# Patient Record
Sex: Male | Born: 1935 | Race: White | Hispanic: No | Marital: Married | Smoking: Former smoker
Health system: Southern US, Community
[De-identification: ages and names within clinical notes are randomized; demographics above are authoritative.]

## PROBLEM LIST (undated history)

## (undated) DIAGNOSIS — M1990 Unspecified osteoarthritis, unspecified site: Secondary | ICD-10-CM

## (undated) DIAGNOSIS — K625 Hemorrhage of anus and rectum: Secondary | ICD-10-CM

## (undated) DIAGNOSIS — F419 Anxiety disorder, unspecified: Secondary | ICD-10-CM

## (undated) DIAGNOSIS — F32A Depression, unspecified: Secondary | ICD-10-CM

## (undated) DIAGNOSIS — F329 Major depressive disorder, single episode, unspecified: Secondary | ICD-10-CM

## (undated) DIAGNOSIS — J449 Chronic obstructive pulmonary disease, unspecified: Secondary | ICD-10-CM

## (undated) DIAGNOSIS — K635 Polyp of colon: Secondary | ICD-10-CM

## (undated) DIAGNOSIS — I10 Essential (primary) hypertension: Secondary | ICD-10-CM

## (undated) DIAGNOSIS — I4891 Unspecified atrial fibrillation: Secondary | ICD-10-CM

## (undated) DIAGNOSIS — I499 Cardiac arrhythmia, unspecified: Secondary | ICD-10-CM

## (undated) DIAGNOSIS — J189 Pneumonia, unspecified organism: Secondary | ICD-10-CM

## (undated) HISTORY — PX: CHOLECYSTECTOMY: SHX55

---

## 2006-06-06 ENCOUNTER — Ambulatory Visit: Payer: Self-pay | Admitting: Cardiology

## 2007-11-19 ENCOUNTER — Ambulatory Visit (HOSPITAL_COMMUNITY): Admission: RE | Admit: 2007-11-19 | Discharge: 2007-11-19 | Payer: Self-pay | Admitting: Ophthalmology

## 2011-01-20 ENCOUNTER — Other Ambulatory Visit (INDEPENDENT_AMBULATORY_CARE_PROVIDER_SITE_OTHER): Payer: Self-pay | Admitting: *Deleted

## 2011-01-20 DIAGNOSIS — Z86010 Personal history of colonic polyps: Secondary | ICD-10-CM

## 2011-02-02 MED ORDER — SODIUM CHLORIDE 0.45 % IV SOLN
Freq: Once | INTRAVENOUS | Status: AC
  Administered 2011-02-03: 08:00:00 via INTRAVENOUS

## 2011-02-03 ENCOUNTER — Encounter (HOSPITAL_COMMUNITY): Payer: Self-pay

## 2011-02-03 ENCOUNTER — Other Ambulatory Visit (INDEPENDENT_AMBULATORY_CARE_PROVIDER_SITE_OTHER): Payer: Self-pay | Admitting: Internal Medicine

## 2011-02-03 ENCOUNTER — Ambulatory Visit (HOSPITAL_COMMUNITY)
Admission: RE | Admit: 2011-02-03 | Discharge: 2011-02-03 | Disposition: A | Discharge status: Home / Self Care | Payer: Medicare Other | Source: Ambulatory Visit | Attending: Internal Medicine | Admitting: Internal Medicine

## 2011-02-03 ENCOUNTER — Encounter (INDEPENDENT_AMBULATORY_CARE_PROVIDER_SITE_OTHER): Payer: Self-pay | Admitting: Internal Medicine

## 2011-02-03 ENCOUNTER — Encounter (HOSPITAL_COMMUNITY)
Admission: RE | Disposition: A | Discharge status: Home / Self Care | Payer: Self-pay | Source: Ambulatory Visit | Attending: Internal Medicine

## 2011-02-03 DIAGNOSIS — Z86010 Personal history of colonic polyps: Secondary | ICD-10-CM

## 2011-02-03 DIAGNOSIS — D126 Benign neoplasm of colon, unspecified: Secondary | ICD-10-CM

## 2011-02-03 HISTORY — DX: Cardiac arrhythmia, unspecified: I49.9

## 2011-02-03 HISTORY — PX: COLONOSCOPY: SHX5424

## 2011-02-03 HISTORY — DX: Essential (primary) hypertension: I10

## 2011-02-03 SURGERY — COLONOSCOPY
Anesthesia: Moderate Sedation

## 2011-02-03 MED ORDER — MIDAZOLAM HCL 5 MG/5ML IJ SOLN
INTRAMUSCULAR | Status: DC | PRN
  Administered 2011-02-03 (×2): 2 mg via INTRAVENOUS

## 2011-02-03 MED ORDER — STERILE WATER FOR IRRIGATION IR SOLN
Status: AC | PRN
  Administered 2011-02-03: 09:00:00

## 2011-02-03 MED ORDER — MIDAZOLAM HCL 5 MG/5ML IJ SOLN
INTRAMUSCULAR | Status: AC
  Filled 2011-02-03: qty 10

## 2011-02-03 MED ORDER — MEPERIDINE HCL 50 MG/ML IJ SOLN
INTRAMUSCULAR | Status: AC
  Filled 2011-02-03: qty 1

## 2011-02-03 MED ORDER — MEPERIDINE HCL 50 MG/ML IJ SOLN
INTRAMUSCULAR | Status: DC | PRN
  Administered 2011-02-03: 25 mg via INTRAVENOUS

## 2011-02-03 MED ORDER — SODIUM CHLORIDE 0.9 % IJ SOLN
INTRAMUSCULAR | Status: DC | PRN
  Administered 2011-02-03: 5 mL

## 2011-02-03 NOTE — Op Note (Signed)
COLONOSCOPY PROCEDURE REPORT  PATIENT:  Raymond Nunez  MR#:  [id number] Birthdate:  07-02-1929, 77 y.o., male Endoscopist:  Dr. Malissa Hippo, MD Referred By:  Dr. Donzetta Sprung, M.D. Procedure Date: 02/03/2011  Procedure:   Colonoscopy with polypectomy.  Indications:  Patient is 77 year old Caucasian male with history of complex sessile cecal polyp and piecemeal polypectomy plus APC therapy in September 2011 complicated by post-polypectomy bleed controlled endoscopically. Patient not requiring transfusion to is now returning to make sure he does not have any residual polyp.  Informed Consent: Seizure and risks were reviewed with the patient informed consent was obtained. Medications:  Demerol 25  mg IV Versed 4  mg IV  Description of procedure:  After a digital rectal exam was performed, that colonoscope was advanced from the anus through the rectum and colon to the area of the cecum, ileocecal valve and appendiceal orifice. The cecum was deeply intubated. These structures were well-seen and photographed for the record. From the level of the cecum and ileocecal valve, the scope was slowly and cautiously withdrawn. The mucosal surfaces were carefully surveyed utilizing scope tip to flexion to facilitate fold flattening as needed. The scope was pulled down into the rectum where a thorough exam including retroflexion was performed.  Findings:   Preparation was satisfactory except she had thick stool coating cecal mucosa. After vigorous washing landmarks were well-seen. 8 mm sessile polyp at cecum. Polyp was snared after saline injection. Unable to place a clip at polypectomy site. His interval cecal polyp which was partly snared and partly coagulated with APC. 3 mm cecal polyp ablated with APC. Mall polyp at distal sigmoid colon was coagulated with APC. Anorectal junction unremarkable.  Therapeutic/Diagnostic Maneuvers Performed:  See above  Complications:  None  Cecal Withdrawal Time:  34  minutes  Impression:  Residual cecal polyp was treated with a combination of polypectomy and APC. 8 mm sessile polyp snared from cecum following saline injection. Two small polyps were coagulated with APC; one was at cecum and the second one at distal sigmoid colon.  Recommendations:  Resume Coumadin on 02/05/2011. No aspirin for 10 days. I will be contacting patient with results of biopsy.   REHMAN,NAJEEB U  02/03/2011 9:29 AM  CC: Dr. No primary provider on file. & Dr. No ref. provider found

## 2011-02-03 NOTE — H&P (Signed)
Raymond Nunez is an 77 y.o. male.   Chief Complaint: Patient is here for colonoscopy. HPI: This 77 year old Caucasian male who was found to have large sessile cecal polyp moved in September 2011. He is returning for 6 to make sure he does not have any residual polyp. He denies abdominal pain melena or rectal bleeding. Family history is negative for colorectal carcinoma.   Past Medical History  Diagnosis Date  . Hypertension   . Dysrhythmia     Past Surgical History  Procedure Date  . Cholecystectomy     History reviewed. No pertinent family history. Social History:  reports that he has never smoked. He does not have any smokeless tobacco history on file. He reports that he does not use illicit drugs. His alcohol history not on file.  Allergies: No Known Allergies  Medications Prior to Admission  Medication Dose Route Frequency Provider Last Rate Last Dose  . 0.45 % sodium chloride infusion   Intravenous Once Malissa Hippo, MD 20 mL/hr at 02/03/11 0801    . meperidine (DEMEROL) 50 MG/ML injection           . midazolam (VERSED) 5 MG/5ML injection            Medications Prior to Admission  Medication Sig Dispense Refill  . acetaminophen (TYLENOL) 325 MG tablet Take 650 mg by mouth every 6 (six) hours as needed. For back pain.       Marland Kitchen acetaminophen (TYLENOL) 650 MG CR tablet Take 650 mg by mouth every 8 (eight) hours as needed. For back pain [employer]       . allopurinol (ZYLOPRIM) 300 MG tablet Take 300 mg by mouth daily.        . cholecalciferol (VITAMIN D) 1000 UNITS tablet Take 1,000 Units by mouth daily.        . citalopram (CELEXA) 20 MG tablet Take 20 mg by mouth daily.        Marland Kitchen diltiazem (CARDIZEM CD) 240 MG 24 hr capsule Take 240 mg by mouth daily.        . fish oil-omega-3 fatty acids 1000 MG capsule Take 2 g by mouth daily.        Marland Kitchen GLUCOSAMINE PO Take 2,000 mg by mouth daily.        Marland Kitchen lidocaine (LIDODERM) 5 % Place 1 patch onto the skin daily as needed. For back  pain. Remove & Discard patch within 12 hours or as directed by MD       . losartan-hydrochlorothiazide (HYZAAR) 50-12.5 MG per tablet Take 1 tablet by mouth.        . metoprolol (LOPRESSOR) 50 MG tablet Take 50 mg by mouth 2 (two) times daily.        . Multiple Vitamins-Minerals (MULTIVITAMINS THER. W/MINERALS) TABS Take 1 tablet by mouth daily. Centrum Heart       . Probiotic Product (HEALTHY COLON PO) Take 1 tablet by mouth daily.        . ranitidine (ZANTAC) 150 MG tablet Take 150 mg by mouth 2 (two) times daily.        . vitamin B-12 (CYANOCOBALAMIN) 1000 MCG tablet Take 1,000 mcg by mouth daily.        Marland Kitchen warfarin (COUMADIN) 2 MG tablet Take 2 mg by mouth daily.          No results found for this or any previous visit (from the past 48 hour(s)). No results found.  Review of Systems  Constitutional: Negative for  weight loss.  Gastrointestinal: Negative for abdominal pain, diarrhea, constipation, blood in stool and melena.    Blood pressure 122/89, pulse 84, temperature 98.3 F (36.8 C), temperature source Oral, resp. rate 18, height 5\' 9"  (1.753 m), weight 225 lb (102.059 kg), SpO2 95.00%. Physical Exam  Constitutional: He appears well-developed and well-nourished.  HENT:  Mouth/Throat: Oropharynx is clear and moist.  Eyes: Conjunctivae are normal. No scleral icterus.  Neck: No thyromegaly present.  Cardiovascular: Normal rate and normal heart sounds.   No murmur heard.      Irregular rhythm  Respiratory: Effort normal and breath sounds normal.  GI: He exhibits no distension and no mass.  Musculoskeletal: He exhibits no edema.  Lymphadenopathy:    He has no cervical adenopathy.  Neurological: He is alert.  Skin: Skin is warm and dry.     Assessment/Plan History of complex sessile cecal polyp. Colonoscopy with polypectomy if he has residual polyp  Emil Klassen U 02/03/2011, 8:39 AM

## 2011-02-04 ENCOUNTER — Telehealth (INDEPENDENT_AMBULATORY_CARE_PROVIDER_SITE_OTHER): Payer: Self-pay | Admitting: Internal Medicine

## 2011-02-04 ENCOUNTER — Encounter (HOSPITAL_COMMUNITY): Payer: Self-pay | Admitting: *Deleted

## 2011-02-04 ENCOUNTER — Inpatient Hospital Stay (HOSPITAL_COMMUNITY)
Admission: EM | Admit: 2011-02-04 | Discharge: 2011-02-05 | DRG: 921 | Disposition: A | Discharge status: Home / Self Care | Payer: Medicare Other | Attending: Internal Medicine | Admitting: Internal Medicine

## 2011-02-04 DIAGNOSIS — K922 Gastrointestinal hemorrhage, unspecified: Secondary | ICD-10-CM

## 2011-02-04 DIAGNOSIS — I9589 Other hypotension: Secondary | ICD-10-CM | POA: Diagnosis present

## 2011-02-04 DIAGNOSIS — I4891 Unspecified atrial fibrillation: Secondary | ICD-10-CM | POA: Diagnosis present

## 2011-02-04 DIAGNOSIS — IMO0002 Reserved for concepts with insufficient information to code with codable children: Principal | ICD-10-CM | POA: Diagnosis present

## 2011-02-04 DIAGNOSIS — D126 Benign neoplasm of colon, unspecified: Secondary | ICD-10-CM | POA: Diagnosis present

## 2011-02-04 DIAGNOSIS — F439 Reaction to severe stress, unspecified: Secondary | ICD-10-CM

## 2011-02-04 DIAGNOSIS — I10 Essential (primary) hypertension: Secondary | ICD-10-CM | POA: Diagnosis present

## 2011-02-04 DIAGNOSIS — K219 Gastro-esophageal reflux disease without esophagitis: Secondary | ICD-10-CM | POA: Diagnosis present

## 2011-02-04 DIAGNOSIS — Y838 Other surgical procedures as the cause of abnormal reaction of the patient, or of later complication, without mention of misadventure at the time of the procedure: Secondary | ICD-10-CM | POA: Diagnosis present

## 2011-02-04 HISTORY — DX: Hemorrhage of anus and rectum: K62.5

## 2011-02-04 LAB — TYPE AND SCREEN: Antibody Screen: NEGATIVE

## 2011-02-04 LAB — COMPREHENSIVE METABOLIC PANEL
ALT: 10 U/L (ref 0–53)
AST: 20 U/L (ref 0–37)
Albumin: 3.7 g/dL (ref 3.5–5.2)
Calcium: 9.2 mg/dL (ref 8.4–10.5)
Creatinine, Ser: 1.06 mg/dL (ref 0.50–1.35)
Sodium: 138 mEq/L (ref 135–145)
Total Protein: 7 g/dL (ref 6.0–8.3)

## 2011-02-04 LAB — CBC
MCH: 31.4 pg (ref 26.0–34.0)
MCHC: 33.1 g/dL (ref 30.0–36.0)
MCV: 94.8 fL (ref 78.0–100.0)
Platelets: 187 10*3/uL (ref 150–400)
RDW: 14.3 % (ref 11.5–15.5)
WBC: 13 10*3/uL — ABNORMAL HIGH (ref 4.0–10.5)

## 2011-02-04 LAB — DIFFERENTIAL
Basophils Absolute: 0 10*3/uL (ref 0.0–0.1)
Basophils Relative: 0 % (ref 0–1)
Eosinophils Absolute: 0 10*3/uL (ref 0.0–0.7)
Eosinophils Relative: 0 % (ref 0–5)
Lymphocytes Relative: 12 % (ref 12–46)
Monocytes Absolute: 1 10*3/uL (ref 0.1–1.0)

## 2011-02-04 LAB — PROTIME-INR: INR: 1.04 (ref 0.00–1.49)

## 2011-02-04 LAB — HEMOGLOBIN AND HEMATOCRIT, BLOOD: Hemoglobin: 13.2 g/dL (ref 13.0–17.0)

## 2011-02-04 MED ORDER — DILTIAZEM HCL ER COATED BEADS 240 MG PO CP24
240.0000 mg | ORAL_CAPSULE | Freq: Every day | ORAL | Status: DC
  Administered 2011-02-05: 240 mg via ORAL
  Filled 2011-02-04: qty 1

## 2011-02-04 MED ORDER — SODIUM CHLORIDE 0.9 % IV SOLN
INTRAVENOUS | Status: DC
  Administered 2011-02-04 – 2011-02-05 (×3): via INTRAVENOUS

## 2011-02-04 MED ORDER — METOPROLOL TARTRATE 50 MG PO TABS
50.0000 mg | ORAL_TABLET | Freq: Every day | ORAL | Status: DC
  Administered 2011-02-05: 50 mg via ORAL
  Filled 2011-02-04: qty 1

## 2011-02-04 NOTE — ED Notes (Signed)
Pt c/o bleeding from his rectum. Pt states he woke this am at 0200 feeling pressure in lower abd and thought he had gas. Pt states he then went to the restroom and noticed blood in the toilet. Pt states he has gone 2 other times since then and blood was still present.

## 2011-02-04 NOTE — Telephone Encounter (Signed)
Patient had a tcs yesterday, 02/03/11, and has been up all night bleeding. Please return the call.

## 2011-02-04 NOTE — H&P (Signed)
Patient is 77 year old Caucasian male who was admitted earlier today were post-polypectomy bleed. He underwent colonoscopy yesterday that the snare polypectomy of cecal polyp along with the polypectomy and APC of residual cecal polyp. His bleeding started around 2 AM. He did not experience any abdominal pain or postural symptoms. His blood pressure on admission was low felt to be due to cardiazam and metoprolol. His INR is 1.04 H&H on admission was 15.6 and 47.1 with platelet count 187K BUN is 17 creatinine is 1.06 Both of these polyps turned out to be sessile serrated adenomas. It appears patient has either stop bleeding or slow down as he is gone over 6 hours without bowel movement hour therefore watch him closely. If there is evidence of active bleed will proceed with therapeutic colonoscopy

## 2011-02-04 NOTE — Telephone Encounter (Signed)
I called the patient's wife at [phone] , she states that Mr. Raymond Nunez awoke at 2 am with rectal bleeding , and noted this x 3. This morning he has had 2 more episodes. He complains of feeling weak, no dizziness. Dr. Karilyn Cota notified,  He states that the patient should be direct admitt to [facility] - Post Polypectomy Bleed. Per admitting to [facility] no beds at this time , patient should go to ER. Dr. Karilyn Cota made aware and [name]. She is going to bring him , [name], NP called ED/[facility] and made them aware.

## 2011-02-04 NOTE — H&P (Signed)
Raymond Nunez is an 77 y.o. male.   Chief Complaint: HPI Raymond Nunez is 77 yr old male presenting today with rectal bleeding. He has history of a complex sessile cecal polyp and piecemeal polypectomy plus APC therapy in September of 2011 complicated by post post-polypectomy bleed controlled endoscopically. He underwent a colonoscopy with polypectomy yesterday 02/03/2011. He had a residual  cecal polyp that was treated with a combination of polypectomy and APC.   He also had a 8mm sessile polyp snard from cecum following saline injection.  Two small polyps were coagulated with APC; one was at the cecum and the second one at the distal sigmoid colon. He tells me that around 2 am he began to have rectal bleeding which he describes as bright red.  He has had 3 other episodes today of bright red rectal bleeding which he describes as a large amount. Dr. Karilyn Cota spoke with Raymond Nunez earlier today and was advised that he would be directly admitted. However, there were no available beds and he was advised to go to the Emergency Dept.   Past Medical History  Diagnosis Date  . Hypertension   . Dysrhythmia   . Rectal bleeding     Past Surgical History  Procedure Date  . Cholecystectomy     History reviewed. No pertinent family history. Social History:  reports that he has never smoked. He does not have any smokeless tobacco history on file. He reports that he does not drink alcohol or use illicit drugs.  Allergies: No Known Allergies  Medications Prior to Admission  Medication Dose Route Frequency Provider Last Rate Last Dose  . 0.45 % sodium chloride infusion   Intravenous Once Malissa Hippo, MD 20 mL/hr at 02/03/11 0801    . 0.9 %  sodium chloride infusion   Intravenous Continuous Llana Aliment, NP      . simethicone susp in sterile water 1000 mL irrigation    PRN Malissa Hippo, MD      . DISCONTD: meperidine (DEMEROL) 50 MG/ML injection           . DISCONTD: meperidine (DEMEROL) injection    PRN Malissa Hippo, MD   25 mg at 02/03/11 0844  . DISCONTD: midazolam (VERSED) 5 MG/5ML injection           . DISCONTD: midazolam (VERSED) 5 MG/5ML injection    PRN Malissa Hippo, MD   2 mg at 02/03/11 0846  . DISCONTD: sodium chloride 0.9 % injection    PRN Malissa Hippo, MD   5 mL at 02/03/11 0933   Medications Prior to Admission  Medication Sig Dispense Refill  . acetaminophen (TYLENOL) 325 MG tablet Take 650 mg by mouth every 6 (six) hours as needed. For back pain.       Marland Kitchen acetaminophen (TYLENOL) 650 MG CR tablet Take 650 mg by mouth every 8 (eight) hours as needed. For back pain [employer]       . allopurinol (ZYLOPRIM) 300 MG tablet Take 300 mg by mouth daily.        . cholecalciferol (VITAMIN D) 1000 UNITS tablet Take 1,000 Units by mouth daily.        . citalopram (CELEXA) 20 MG tablet Take 20 mg by mouth daily.        Marland Kitchen diltiazem (CARDIZEM CD) 240 MG 24 hr capsule Take 240 mg by mouth daily.        . fish oil-omega-3 fatty acids 1000 MG capsule Take  2 g by mouth daily.        Marland Kitchen GLUCOSAMINE PO Take 2,000 mg by mouth daily.        Marland Kitchen lidocaine (LIDODERM) 5 % Place 1 patch onto the skin daily as needed. For back pain. Remove & Discard patch within 12 hours or as directed by MD       . losartan-hydrochlorothiazide (HYZAAR) 50-12.5 MG per tablet Take 1 tablet by mouth.        . metoprolol (LOPRESSOR) 50 MG tablet Take 50 mg by mouth 2 (two) times daily.        . Multiple Vitamins-Minerals (MULTIVITAMINS THER. Nunez/MINERALS) TABS Take 1 tablet by mouth daily. Centrum Heart       . Probiotic Product (HEALTHY COLON PO) Take 1 tablet by mouth daily.        . ranitidine (ZANTAC) 150 MG tablet Take 150 mg by mouth 2 (two) times daily.        . vitamin B-12 (CYANOCOBALAMIN) 1000 MCG tablet Take 1,000 mcg by mouth daily.          No results found for this or any previous visit (from the past 48 hour(s)). No results found.  ROS see hpi  Blood pressure 97/71, pulse 79, temperature 97.5 F (36.4  C), temperature source Oral, resp. rate 16, height 5\' 9"  (1.753 m), weight 225 lb (102.059 kg), SpO2 96.00%. Physical Exam Alert and oriented. Skin warm and dry. Oral mucosa is moist. Natural teeth in good condition. Sclera anicteric, conjunctivae is pink. Thyroid not enlarged. No cervical lymphadenopathy. Lungs clear. Heart regular rate and rhythm.  Abdomen is soft. Bowel sounds are positive. No hepatomegaly. No abdominal masses felt. No tenderness. He has bright red blood from his rectum (small amt). No edema to lower extremities. Patient is alert and oriented.   Assessment/Plan  Probably post polypectomy bleed.  Plan.   IV NS, CBC, PT/INR, Cmet, Keep patient NPO. Dr. Karilyn Cota will be in to see patient. His vitals at this time are stable.  Raymond Nunez 02/04/2011, 10:58 AM

## 2011-02-05 LAB — CBC
MCH: 32 pg (ref 26.0–34.0)
MCHC: 33.8 g/dL (ref 30.0–36.0)
Platelets: 152 10*3/uL (ref 150–400)

## 2011-02-05 MED ORDER — CITALOPRAM HYDROBROMIDE 20 MG PO TABS
20.0000 mg | ORAL_TABLET | Freq: Every day | ORAL | Status: DC
  Administered 2011-02-05: 20 mg via ORAL
  Filled 2011-02-05: qty 1

## 2011-02-05 MED ORDER — WARFARIN SODIUM 2 MG PO TABS: 2.0000 mg | ORAL_TABLET | Freq: Every day | ORAL | Status: AC

## 2011-02-05 MED ORDER — SODIUM CHLORIDE 0.9 % IJ SOLN
INTRAMUSCULAR | Status: AC
  Administered 2011-02-05: 11:00:00
  Filled 2011-02-05: qty 3

## 2011-02-05 NOTE — Progress Notes (Signed)
Subjective; patient has no complaints. He had a bowel movement last night when he passed some clots and old blood. He had another one this morning when he passed possibly an ounce of dark blood in stool. He is hungry. He is ambulating on the floor without any weakness. Objective BP 118/67  Pulse 79  Temp(Src) 98.1 F (36.7 C) (Oral)  Resp 18  Ht 5\' 9"  (1.753 m)  Wt 225 lb (102.059 kg)  BMI 33.23 kg/m2  SpO2 94% H&H last night was 13.2 and 39.2. H&H from this morning is 13.3 and 39.4. Assessment. Post polypectomy bleed has stopped without any intervention. His hemoglobin has dropped by over 2 g some of the drop may be due to hydration. Plan Will monitor him in the hospital for 6 hours or so. Unless he rebleeds he will be going home later today. He is back on his cardiazam, metoprolol and Celexa. Will resume his Coumadin middle of next week.

## 2011-02-06 NOTE — Discharge Summary (Signed)
Principal diagnosis; post-polypectomy GI bleed.  Secondary diagnoses; Transient hypotension secondary to medications. Atrial fibrillation. Hypertension. Stress disorder. GERD. History of gout. Chronic low back pain. Condition at the time of discharge. Stable.  Discharge medications. Citalopram 20 mg by mouth daily. Diltiazem 240 mg by mouth daily Losartan/hydrochlorothiazide 50/12.5 mg by mouth daily. Metoprolol 50 mg by mouth twice a day. Ranitidine 75 mg by mouth twice a day. Fish oil make a 3 fatty acids 1 g by mouth twice a day. Glucosamine 2 g by mouth daily. Multivitamin with minerals 1 tablet by mouth daily. B12 1000 mcg by mouth daily. Allopurinol 300 mg by mouth daily. Tylenol 650 mg by mouth every 6 hours as needed for back pain. Vitamin D 1000 units by mouth daily. Lidoderm patch to back in the as needed as directed. Probiotic one tablet daily by mouth. Warfarin 2 mg by mouth daily; start on 02/09/2011.  Hospital course; Patient is 77 year old Caucasian male with history of colonic polyps. He had large sessile cecal polyp removed in September 2011 indicated by post-polypectomy bleed he did not require any transfusion. He had 2 cecal polyps removed date prior to admission. He began to pass bright red blood per rectum but 12 hours after polypectomy. He did not experience any postural symptoms or abdominal pain. On admission his blood pressure was low at heart rate was within normal limits. His blood pressure came up with IV fluids. Since his H&H was normal or above normal for him I felt that he may have mild dehydration or hypotension could be secondary to his medications which he took prior to coming to the hospital. Admission his WBC was 13.0, hemoglobin 15.6, hematocrit 47.1 and platelet count was 187K. his chemistry panel was normal except random glucose of 134. INR was 1.04. Patient was typed and crossmatched. He was begun on clear liquids and monitored. He had a bowel  movement about 8 hours after admission any pass small amount of dark blood. Hemoglobin and hematocrit were repeated on the evening of admission. [place]od is 13.2 and hematocrit 39.2. Following morning he had a bowel movement in the past possibly an ounce of old blood and some stool. Hemoglobin was 13.3 and hematocrit 39.4. I felt drop in his H&H was do to combination of bleed and hydration. Patient was allowed to ambulate on the floor rest of the morning and most of the afternoon and had no more bowel movements or bleeding. Given his clinical course I felt that there was no need for colonoscopy since he had stopped bleeding. Patient  was discharged in stable condition and he was advised to resume warfarin on 02/09/2011. He was also asked not to take any aspirin or OTC NSAIDs. Patient was advised to come back to emergency room in case of recurrence bleeding. Path. was available on these polyps and they were both serrated adenomas.  He will get his INR checked in 10-14 days after going back on warfarin

## 2011-02-10 ENCOUNTER — Encounter (HOSPITAL_COMMUNITY): Payer: Self-pay | Admitting: Internal Medicine

## 2011-02-10 ENCOUNTER — Encounter (INDEPENDENT_AMBULATORY_CARE_PROVIDER_SITE_OTHER): Payer: Self-pay | Admitting: *Deleted

## 2011-04-26 ENCOUNTER — Other Ambulatory Visit (HOSPITAL_COMMUNITY): Payer: Self-pay | Admitting: Internal Medicine

## 2011-04-26 NOTE — Telephone Encounter (Signed)
Sent to the wrong physician.

## 2012-03-21 ENCOUNTER — Encounter (INDEPENDENT_AMBULATORY_CARE_PROVIDER_SITE_OTHER): Payer: Self-pay

## 2014-04-16 DIAGNOSIS — I482 Chronic atrial fibrillation: Secondary | ICD-10-CM | POA: Diagnosis not present

## 2014-04-24 DIAGNOSIS — X32XXXA Exposure to sunlight, initial encounter: Secondary | ICD-10-CM | POA: Diagnosis not present

## 2014-04-24 DIAGNOSIS — I781 Nevus, non-neoplastic: Secondary | ICD-10-CM | POA: Diagnosis not present

## 2014-04-24 DIAGNOSIS — L570 Actinic keratosis: Secondary | ICD-10-CM | POA: Diagnosis not present

## 2014-05-05 DIAGNOSIS — I10 Essential (primary) hypertension: Secondary | ICD-10-CM | POA: Diagnosis not present

## 2014-05-05 DIAGNOSIS — K210 Gastro-esophageal reflux disease with esophagitis: Secondary | ICD-10-CM | POA: Diagnosis not present

## 2014-05-05 DIAGNOSIS — E782 Mixed hyperlipidemia: Secondary | ICD-10-CM | POA: Diagnosis not present

## 2014-05-05 DIAGNOSIS — R7302 Impaired glucose tolerance (oral): Secondary | ICD-10-CM | POA: Diagnosis not present

## 2014-05-15 DIAGNOSIS — E782 Mixed hyperlipidemia: Secondary | ICD-10-CM | POA: Diagnosis not present

## 2014-05-15 DIAGNOSIS — F331 Major depressive disorder, recurrent, moderate: Secondary | ICD-10-CM | POA: Diagnosis not present

## 2014-05-15 DIAGNOSIS — E8881 Metabolic syndrome: Secondary | ICD-10-CM | POA: Diagnosis not present

## 2014-05-15 DIAGNOSIS — M1000 Idiopathic gout, unspecified site: Secondary | ICD-10-CM | POA: Diagnosis not present

## 2014-05-15 DIAGNOSIS — Z1389 Encounter for screening for other disorder: Secondary | ICD-10-CM | POA: Diagnosis not present

## 2014-05-28 DIAGNOSIS — I482 Chronic atrial fibrillation: Secondary | ICD-10-CM | POA: Diagnosis not present

## 2014-07-02 DIAGNOSIS — I482 Chronic atrial fibrillation: Secondary | ICD-10-CM | POA: Diagnosis not present

## 2014-08-06 DIAGNOSIS — I482 Chronic atrial fibrillation: Secondary | ICD-10-CM | POA: Diagnosis not present

## 2014-08-07 DIAGNOSIS — D225 Melanocytic nevi of trunk: Secondary | ICD-10-CM | POA: Diagnosis not present

## 2014-08-07 DIAGNOSIS — C44619 Basal cell carcinoma of skin of left upper limb, including shoulder: Secondary | ICD-10-CM | POA: Diagnosis not present

## 2014-08-07 DIAGNOSIS — L570 Actinic keratosis: Secondary | ICD-10-CM | POA: Diagnosis not present

## 2014-08-07 DIAGNOSIS — X32XXXD Exposure to sunlight, subsequent encounter: Secondary | ICD-10-CM | POA: Diagnosis not present

## 2014-08-07 DIAGNOSIS — L280 Lichen simplex chronicus: Secondary | ICD-10-CM | POA: Diagnosis not present

## 2014-09-10 DIAGNOSIS — I482 Chronic atrial fibrillation: Secondary | ICD-10-CM | POA: Diagnosis not present

## 2014-10-15 DIAGNOSIS — I482 Chronic atrial fibrillation: Secondary | ICD-10-CM | POA: Diagnosis not present

## 2014-11-19 DIAGNOSIS — I482 Chronic atrial fibrillation: Secondary | ICD-10-CM | POA: Diagnosis not present

## 2014-12-30 DIAGNOSIS — E8881 Metabolic syndrome: Secondary | ICD-10-CM | POA: Diagnosis not present

## 2014-12-30 DIAGNOSIS — I10 Essential (primary) hypertension: Secondary | ICD-10-CM | POA: Diagnosis not present

## 2014-12-30 DIAGNOSIS — E782 Mixed hyperlipidemia: Secondary | ICD-10-CM | POA: Diagnosis not present

## 2014-12-30 DIAGNOSIS — R7302 Impaired glucose tolerance (oral): Secondary | ICD-10-CM | POA: Diagnosis not present

## 2014-12-30 DIAGNOSIS — I482 Chronic atrial fibrillation: Secondary | ICD-10-CM | POA: Diagnosis not present

## 2014-12-30 DIAGNOSIS — F331 Major depressive disorder, recurrent, moderate: Secondary | ICD-10-CM | POA: Diagnosis not present

## 2015-01-01 DIAGNOSIS — M1000 Idiopathic gout, unspecified site: Secondary | ICD-10-CM | POA: Diagnosis not present

## 2015-01-01 DIAGNOSIS — E782 Mixed hyperlipidemia: Secondary | ICD-10-CM | POA: Diagnosis not present

## 2015-01-01 DIAGNOSIS — Z0001 Encounter for general adult medical examination with abnormal findings: Secondary | ICD-10-CM | POA: Diagnosis not present

## 2015-01-01 DIAGNOSIS — F331 Major depressive disorder, recurrent, moderate: Secondary | ICD-10-CM | POA: Diagnosis not present

## 2015-01-01 DIAGNOSIS — E8881 Metabolic syndrome: Secondary | ICD-10-CM | POA: Diagnosis not present

## 2015-01-01 DIAGNOSIS — Z23 Encounter for immunization: Secondary | ICD-10-CM | POA: Diagnosis not present

## 2015-01-20 DIAGNOSIS — K219 Gastro-esophageal reflux disease without esophagitis: Secondary | ICD-10-CM | POA: Diagnosis not present

## 2015-01-20 DIAGNOSIS — S20211A Contusion of right front wall of thorax, initial encounter: Secondary | ICD-10-CM | POA: Diagnosis not present

## 2015-01-27 DIAGNOSIS — I482 Chronic atrial fibrillation: Secondary | ICD-10-CM | POA: Diagnosis not present

## 2015-01-27 DIAGNOSIS — R0602 Shortness of breath: Secondary | ICD-10-CM | POA: Diagnosis not present

## 2015-01-27 DIAGNOSIS — I10 Essential (primary) hypertension: Secondary | ICD-10-CM | POA: Diagnosis not present

## 2015-01-30 DIAGNOSIS — I358 Other nonrheumatic aortic valve disorders: Secondary | ICD-10-CM | POA: Diagnosis not present

## 2015-01-30 DIAGNOSIS — I340 Nonrheumatic mitral (valve) insufficiency: Secondary | ICD-10-CM | POA: Diagnosis not present

## 2015-01-30 DIAGNOSIS — R0602 Shortness of breath: Secondary | ICD-10-CM | POA: Diagnosis not present

## 2015-01-30 DIAGNOSIS — I361 Nonrheumatic tricuspid (valve) insufficiency: Secondary | ICD-10-CM | POA: Diagnosis not present

## 2015-01-30 DIAGNOSIS — R609 Edema, unspecified: Secondary | ICD-10-CM | POA: Diagnosis not present

## 2015-01-30 DIAGNOSIS — I083 Combined rheumatic disorders of mitral, aortic and tricuspid valves: Secondary | ICD-10-CM | POA: Diagnosis not present

## 2015-02-03 DIAGNOSIS — I482 Chronic atrial fibrillation: Secondary | ICD-10-CM | POA: Diagnosis not present

## 2015-02-11 DIAGNOSIS — J449 Chronic obstructive pulmonary disease, unspecified: Secondary | ICD-10-CM | POA: Diagnosis not present

## 2015-02-11 DIAGNOSIS — R072 Precordial pain: Secondary | ICD-10-CM | POA: Diagnosis not present

## 2015-03-03 DIAGNOSIS — I10 Essential (primary) hypertension: Secondary | ICD-10-CM | POA: Diagnosis not present

## 2015-03-03 DIAGNOSIS — J449 Chronic obstructive pulmonary disease, unspecified: Secondary | ICD-10-CM | POA: Diagnosis not present

## 2015-03-05 ENCOUNTER — Other Ambulatory Visit (HOSPITAL_COMMUNITY): Payer: Self-pay | Admitting: Respiratory Therapy

## 2015-03-05 DIAGNOSIS — R0602 Shortness of breath: Secondary | ICD-10-CM

## 2015-03-09 ENCOUNTER — Other Ambulatory Visit (HOSPITAL_COMMUNITY): Payer: Self-pay | Admitting: Pulmonary Disease

## 2015-03-09 DIAGNOSIS — R0602 Shortness of breath: Secondary | ICD-10-CM

## 2015-03-09 DIAGNOSIS — R0789 Other chest pain: Secondary | ICD-10-CM

## 2015-03-10 DIAGNOSIS — I482 Chronic atrial fibrillation: Secondary | ICD-10-CM | POA: Diagnosis not present

## 2015-03-11 ENCOUNTER — Ambulatory Visit (HOSPITAL_COMMUNITY)
Admission: RE | Admit: 2015-03-11 | Discharge: 2015-03-11 | Disposition: A | Discharge status: Home / Self Care | Payer: Medicare Other | Source: Ambulatory Visit | Attending: Pulmonary Disease | Admitting: Pulmonary Disease

## 2015-03-11 ENCOUNTER — Ambulatory Visit (HOSPITAL_COMMUNITY): Admission: RE | Admit: 2015-03-11 | Payer: Medicare Other | Source: Ambulatory Visit

## 2015-03-11 DIAGNOSIS — R918 Other nonspecific abnormal finding of lung field: Secondary | ICD-10-CM | POA: Diagnosis not present

## 2015-03-11 DIAGNOSIS — Z981 Arthrodesis status: Secondary | ICD-10-CM | POA: Insufficient documentation

## 2015-03-11 DIAGNOSIS — I2510 Atherosclerotic heart disease of native coronary artery without angina pectoris: Secondary | ICD-10-CM | POA: Insufficient documentation

## 2015-03-11 DIAGNOSIS — R0789 Other chest pain: Secondary | ICD-10-CM

## 2015-03-11 DIAGNOSIS — R079 Chest pain, unspecified: Secondary | ICD-10-CM | POA: Insufficient documentation

## 2015-03-11 DIAGNOSIS — R0602 Shortness of breath: Secondary | ICD-10-CM | POA: Diagnosis not present

## 2015-03-11 LAB — POCT I-STAT CREATININE: Creatinine, Ser: 1.1 mg/dL (ref 0.61–1.24)

## 2015-03-11 MED ORDER — IOHEXOL 300 MG/ML  SOLN
75.0000 mL | Freq: Once | INTRAMUSCULAR | Status: AC | PRN
  Administered 2015-03-11: 75 mL via INTRAVENOUS

## 2015-03-13 ENCOUNTER — Ambulatory Visit (HOSPITAL_COMMUNITY)
Admission: RE | Admit: 2015-03-13 | Discharge: 2015-03-13 | Disposition: A | Discharge status: Home / Self Care | Payer: Medicare Other | Source: Ambulatory Visit | Attending: Pulmonary Disease | Admitting: Pulmonary Disease

## 2015-03-13 DIAGNOSIS — R0602 Shortness of breath: Secondary | ICD-10-CM | POA: Diagnosis not present

## 2015-03-13 MED ORDER — ALBUTEROL SULFATE (2.5 MG/3ML) 0.083% IN NEBU
2.5000 mg | INHALATION_SOLUTION | Freq: Once | RESPIRATORY_TRACT | Status: AC
  Administered 2015-03-13: 2.5 mg via RESPIRATORY_TRACT

## 2015-03-16 LAB — PULMONARY FUNCTION TEST
DL/VA % pred: 116 %
DL/VA: 5.25 ml/min/mmHg/L
DLCO UNC % PRED: 69 %
DLCO unc: 21.62 ml/min/mmHg
FEF 25-75 POST: 1.59 L/s
FEF 25-75 Pre: 1.39 L/sec
FEF2575-%CHANGE-POST: 14 %
FEF2575-%PRED-POST: 92 %
FEF2575-%Pred-Pre: 81 %
FEV1-%CHANGE-POST: 4 %
FEV1-%Pred-Post: 71 %
FEV1-%Pred-Pre: 68 %
FEV1-PRE: 1.78 L
FEV1-Post: 1.86 L
FEV1FVC-%Change-Post: -4 %
FEV1FVC-%PRED-PRE: 106 %
FEV6-%Change-Post: 10 %
FEV6-%PRED-POST: 74 %
FEV6-%Pred-Pre: 67 %
FEV6-POST: 2.58 L
FEV6-Pre: 2.34 L
FEV6FVC-%CHANGE-POST: 0 %
FEV6FVC-%PRED-POST: 107 %
FEV6FVC-%Pred-Pre: 106 %
FVC-%CHANGE-POST: 9 %
FVC-%PRED-POST: 69 %
FVC-%Pred-Pre: 63 %
FVC-POST: 2.58 L
FVC-Pre: 2.35 L
PRE FEV1/FVC RATIO: 76 %
PRE FEV6/FVC RATIO: 99 %
Post FEV1/FVC ratio: 72 %
Post FEV6/FVC ratio: 100 %
RV % PRED: 92 %
RV: 2.47 L
TLC % pred: 71 %
TLC: 4.93 L

## 2015-03-23 DIAGNOSIS — D044 Carcinoma in situ of skin of scalp and neck: Secondary | ICD-10-CM | POA: Diagnosis not present

## 2015-03-23 DIAGNOSIS — L570 Actinic keratosis: Secondary | ICD-10-CM | POA: Diagnosis not present

## 2015-03-23 DIAGNOSIS — L820 Inflamed seborrheic keratosis: Secondary | ICD-10-CM | POA: Diagnosis not present

## 2015-03-23 DIAGNOSIS — D0439 Carcinoma in situ of skin of other parts of face: Secondary | ICD-10-CM | POA: Diagnosis not present

## 2015-03-23 DIAGNOSIS — X32XXXD Exposure to sunlight, subsequent encounter: Secondary | ICD-10-CM | POA: Diagnosis not present

## 2015-04-14 DIAGNOSIS — I482 Chronic atrial fibrillation: Secondary | ICD-10-CM | POA: Diagnosis not present

## 2015-04-29 DIAGNOSIS — E782 Mixed hyperlipidemia: Secondary | ICD-10-CM | POA: Diagnosis not present

## 2015-04-29 DIAGNOSIS — I10 Essential (primary) hypertension: Secondary | ICD-10-CM | POA: Diagnosis not present

## 2015-04-29 DIAGNOSIS — R7302 Impaired glucose tolerance (oral): Secondary | ICD-10-CM | POA: Diagnosis not present

## 2015-04-29 DIAGNOSIS — K219 Gastro-esophageal reflux disease without esophagitis: Secondary | ICD-10-CM | POA: Diagnosis not present

## 2015-05-06 DIAGNOSIS — E782 Mixed hyperlipidemia: Secondary | ICD-10-CM | POA: Diagnosis not present

## 2015-05-06 DIAGNOSIS — I482 Chronic atrial fibrillation: Secondary | ICD-10-CM | POA: Diagnosis not present

## 2015-05-06 DIAGNOSIS — I10 Essential (primary) hypertension: Secondary | ICD-10-CM | POA: Diagnosis not present

## 2015-06-15 DIAGNOSIS — I482 Chronic atrial fibrillation: Secondary | ICD-10-CM | POA: Diagnosis not present

## 2015-06-15 DIAGNOSIS — I352 Nonrheumatic aortic (valve) stenosis with insufficiency: Secondary | ICD-10-CM | POA: Diagnosis not present

## 2015-07-15 DIAGNOSIS — I482 Chronic atrial fibrillation: Secondary | ICD-10-CM | POA: Diagnosis not present

## 2015-07-15 DIAGNOSIS — I352 Nonrheumatic aortic (valve) stenosis with insufficiency: Secondary | ICD-10-CM | POA: Diagnosis not present

## 2015-08-04 DIAGNOSIS — R072 Precordial pain: Secondary | ICD-10-CM | POA: Diagnosis not present

## 2015-08-10 DIAGNOSIS — R079 Chest pain, unspecified: Secondary | ICD-10-CM | POA: Diagnosis not present

## 2015-08-10 DIAGNOSIS — R072 Precordial pain: Secondary | ICD-10-CM | POA: Diagnosis not present

## 2015-08-12 DIAGNOSIS — I482 Chronic atrial fibrillation: Secondary | ICD-10-CM | POA: Diagnosis not present

## 2015-08-12 DIAGNOSIS — I352 Nonrheumatic aortic (valve) stenosis with insufficiency: Secondary | ICD-10-CM | POA: Diagnosis not present

## 2015-09-07 DIAGNOSIS — R7302 Impaired glucose tolerance (oral): Secondary | ICD-10-CM | POA: Diagnosis not present

## 2015-09-07 DIAGNOSIS — E782 Mixed hyperlipidemia: Secondary | ICD-10-CM | POA: Diagnosis not present

## 2015-09-07 DIAGNOSIS — I10 Essential (primary) hypertension: Secondary | ICD-10-CM | POA: Diagnosis not present

## 2015-09-09 DIAGNOSIS — I10 Essential (primary) hypertension: Secondary | ICD-10-CM | POA: Diagnosis not present

## 2015-09-09 DIAGNOSIS — E782 Mixed hyperlipidemia: Secondary | ICD-10-CM | POA: Diagnosis not present

## 2015-09-16 DIAGNOSIS — I352 Nonrheumatic aortic (valve) stenosis with insufficiency: Secondary | ICD-10-CM | POA: Diagnosis not present

## 2015-10-21 DIAGNOSIS — I352 Nonrheumatic aortic (valve) stenosis with insufficiency: Secondary | ICD-10-CM | POA: Diagnosis not present

## 2015-12-03 DIAGNOSIS — I482 Chronic atrial fibrillation: Secondary | ICD-10-CM | POA: Diagnosis not present

## 2015-12-03 DIAGNOSIS — I352 Nonrheumatic aortic (valve) stenosis with insufficiency: Secondary | ICD-10-CM | POA: Diagnosis not present

## 2016-01-07 DIAGNOSIS — I10 Essential (primary) hypertension: Secondary | ICD-10-CM | POA: Diagnosis not present

## 2016-01-07 DIAGNOSIS — Z0000 Encounter for general adult medical examination without abnormal findings: Secondary | ICD-10-CM | POA: Diagnosis not present

## 2016-01-07 DIAGNOSIS — E782 Mixed hyperlipidemia: Secondary | ICD-10-CM | POA: Diagnosis not present

## 2016-01-13 DIAGNOSIS — E782 Mixed hyperlipidemia: Secondary | ICD-10-CM | POA: Diagnosis not present

## 2016-01-13 DIAGNOSIS — Z1212 Encounter for screening for malignant neoplasm of rectum: Secondary | ICD-10-CM | POA: Diagnosis not present

## 2016-01-13 DIAGNOSIS — N401 Enlarged prostate with lower urinary tract symptoms: Secondary | ICD-10-CM | POA: Diagnosis not present

## 2016-01-13 DIAGNOSIS — I482 Chronic atrial fibrillation: Secondary | ICD-10-CM | POA: Diagnosis not present

## 2016-01-13 DIAGNOSIS — Z23 Encounter for immunization: Secondary | ICD-10-CM | POA: Diagnosis not present

## 2016-01-13 DIAGNOSIS — Z0001 Encounter for general adult medical examination with abnormal findings: Secondary | ICD-10-CM | POA: Diagnosis not present

## 2016-01-26 ENCOUNTER — Encounter (INDEPENDENT_AMBULATORY_CARE_PROVIDER_SITE_OTHER): Payer: Self-pay | Admitting: Internal Medicine

## 2016-02-02 ENCOUNTER — Ambulatory Visit (INDEPENDENT_AMBULATORY_CARE_PROVIDER_SITE_OTHER): Payer: Medicare Other | Admitting: Internal Medicine

## 2016-02-02 ENCOUNTER — Encounter (INDEPENDENT_AMBULATORY_CARE_PROVIDER_SITE_OTHER): Payer: Self-pay | Admitting: Internal Medicine

## 2016-02-02 ENCOUNTER — Encounter (INDEPENDENT_AMBULATORY_CARE_PROVIDER_SITE_OTHER): Payer: Self-pay | Admitting: *Deleted

## 2016-02-02 ENCOUNTER — Telehealth (INDEPENDENT_AMBULATORY_CARE_PROVIDER_SITE_OTHER): Payer: Self-pay | Admitting: *Deleted

## 2016-02-02 ENCOUNTER — Encounter (INDEPENDENT_AMBULATORY_CARE_PROVIDER_SITE_OTHER): Payer: Self-pay

## 2016-02-02 ENCOUNTER — Other Ambulatory Visit (INDEPENDENT_AMBULATORY_CARE_PROVIDER_SITE_OTHER): Payer: Self-pay | Admitting: Internal Medicine

## 2016-02-02 VITALS — BP 120/86 | HR 65 | Temp 98.3°F | Ht 69.0 in | Wt 244.5 lb

## 2016-02-02 DIAGNOSIS — Z86010 Personal history of colonic polyps: Secondary | ICD-10-CM

## 2016-02-02 DIAGNOSIS — I10 Essential (primary) hypertension: Secondary | ICD-10-CM | POA: Insufficient documentation

## 2016-02-02 DIAGNOSIS — R195 Other fecal abnormalities: Secondary | ICD-10-CM | POA: Diagnosis not present

## 2016-02-02 MED ORDER — PEG 3350-KCL-NA BICARB-NACL 420 G PO SOLR: 4000.0000 mL | Freq: Once | ORAL | 0 refills | Status: AC

## 2016-02-02 NOTE — Telephone Encounter (Signed)
Patient needs trilyte 

## 2016-02-02 NOTE — Patient Instructions (Signed)
Colonoscopy, The risks and benefits such as perforation, bleeding, and infection were reviewed with the patient and is agreeable.

## 2016-02-02 NOTE — Progress Notes (Signed)
Subjective:    Patient ID: Raymond Nunez, male    DOB: [birth year], 82 y.o.   MRN: [id number]  HPI Referred by Dr [name] for + stool card. He denies seeing any blood. No change in his stools. There is no abdominal pain. Appetite is good. No weight loss. His last colonoscopy wasin 20212 with polypectomy (see below)   Maintained on Coumadin (Per records, he has hx of atrial fib)   01/08/2016 H and H 15.0 and 45.2 02/03/2011  Colonoscopy with polypectomy.  Indications:  Patient is 82 year old Caucasian male with history of complex sessile cecal polyp and piecemeal polypectomy plus APC therapy in September [id number] complicated by post-polypectomy bleed controlled endoscopically. Patient not requiring transfusion to is now returning to make sure he does not have any residual polyp. Impression:  Residual cecal polyp was treated with a combination of polypectomy and APC. 8 mm sessile polyp snared from cecum following saline injection. Two small polyps were coagulated with APC; one was at cecum and the second one at distal sigmoid colon.  Both cecal polyps are serrated adenomas; I went over the results with the patient and his wife. He was admitted yesterday for post-polypectomy bleed and will be going home later today. May consider next exam in 5 years  Review of Systems Past Medical History:  Diagnosis Date  . Dysrhythmia   . Hypertension   . Rectal bleeding     Past Surgical History:  Procedure Laterality Date  . CHOLECYSTECTOMY    . COLONOSCOPY  02/03/2011   Procedure: COLONOSCOPY;  Surgeon: Rogene Houston, MD;  Location: [facility];  Service: Endoscopy;  Laterality: N/A;   Current Outpatient Prescriptions  Medication Sig Dispense Refill  . acetaminophen (TYLENOL) 325 MG tablet Take 650 mg by mouth every 6 (six) hours as needed. For back pain.     Marland Kitchen allopurinol (ZYLOPRIM) 300 MG tablet Take 300 mg by mouth daily.      . cholecalciferol (VITAMIN D) 1000 UNITS tablet Take 1,000  Units by mouth daily.      . citalopram (CELEXA) 20 MG tablet Take 20 mg by mouth daily.      Marland Kitchen diltiazem (CARDIZEM CD) 240 MG 24 hr capsule Take 240 mg by mouth daily.      Marland Kitchen GLUCOSAMINE PO Take 2,000 mg by mouth daily.      Marland Kitchen losartan-hydrochlorothiazide (HYZAAR) 50-12.5 MG per tablet Take 1 tablet by mouth.      . metoprolol (LOPRESSOR) 50 MG tablet Take 100 mg by mouth daily.     . psyllium (METAMUCIL) 58.6 % packet Take 1 packet by mouth daily.    . ranitidine (ZANTAC) 75 MG tablet Take 75 mg by mouth 2 (two) times daily.      . traZODone (DESYREL) 50 MG tablet Take 50 mg by mouth at bedtime.    . vitamin B-12 (CYANOCOBALAMIN) 1000 MCG tablet Take 1,000 mcg by mouth daily.      Marland Kitchen warfarin (COUMADIN) 2 MG tablet Take 1 tablet (2 mg total) by mouth daily. (Patient taking differently: Take 3 mg by mouth daily. Takes 2 mg one day and the next 1 mg.) 30 tablet 1  . fish oil-omega-3 fatty acids 1000 MG capsule Take 1 g by mouth 2 (two) times daily.      No current facility-administered medications for this visit.    Facility-Administered Medications Ordered in Other Visits  Medication Dose Route Frequency Provider Last Rate Last Dose  . simethicone susp in sterile water  1000 mL irrigation    PRN Rogene Houston, MD        No Known Allergies       Objective:   Physical Exam  Vitals:   02/02/16 1357  Weight: 244 lb 8 oz (110.9 kg)  Height: 5\' 9"  (1.753 m)   Alert and oriented. Skin warm and dry. Oral mucosa is moist.   . Sclera anicteric, conjunctivae is pink. Thyroid not enlarged. No cervical lymphadenopathy. Lungs clear. Heart regular rate and rhythm.  Abdomen is soft. Bowel sounds are positive. No hepatomegaly. No abdominal masses felt. No tenderness.  No edema to lower extremities.          Assessment & Plan:  Guaiac + stool.  Hx of complex polyp. Needs surveillance colonoscopy.   The risks and benefits such as perforation, bleeding, and infection were reviewed with the  patient and is agreeable.

## 2016-02-04 ENCOUNTER — Telehealth (INDEPENDENT_AMBULATORY_CARE_PROVIDER_SITE_OTHER): Payer: Self-pay | Admitting: *Deleted

## 2016-02-04 NOTE — Telephone Encounter (Signed)
Recommendations. Noted.

## 2016-02-04 NOTE — Telephone Encounter (Signed)
Patient is sch'd for TCS 02/19/16, per Dr [name] it is ok for patient to hold Warfarin 5 days prior to TCS but needs to restart evening after procedure

## 2016-02-05 ENCOUNTER — Encounter (INDEPENDENT_AMBULATORY_CARE_PROVIDER_SITE_OTHER): Payer: Self-pay

## 2016-02-19 ENCOUNTER — Ambulatory Visit (HOSPITAL_COMMUNITY)
Admission: RE | Admit: 2016-02-19 | Discharge: 2016-02-19 | Disposition: A | Discharge status: Home / Self Care | Payer: Medicare Other | Source: Ambulatory Visit | Attending: Internal Medicine | Admitting: Internal Medicine

## 2016-02-19 ENCOUNTER — Encounter (HOSPITAL_COMMUNITY): Payer: Self-pay | Admitting: *Deleted

## 2016-02-19 ENCOUNTER — Encounter (HOSPITAL_COMMUNITY)
Admission: RE | Disposition: A | Discharge status: Home / Self Care | Payer: Self-pay | Source: Ambulatory Visit | Attending: Internal Medicine

## 2016-02-19 DIAGNOSIS — Z86010 Personal history of colonic polyps: Secondary | ICD-10-CM | POA: Diagnosis not present

## 2016-02-19 DIAGNOSIS — Z87891 Personal history of nicotine dependence: Secondary | ICD-10-CM | POA: Diagnosis not present

## 2016-02-19 DIAGNOSIS — Z7901 Long term (current) use of anticoagulants: Secondary | ICD-10-CM | POA: Insufficient documentation

## 2016-02-19 DIAGNOSIS — D122 Benign neoplasm of ascending colon: Secondary | ICD-10-CM | POA: Diagnosis not present

## 2016-02-19 DIAGNOSIS — K644 Residual hemorrhoidal skin tags: Secondary | ICD-10-CM | POA: Diagnosis not present

## 2016-02-19 DIAGNOSIS — I4891 Unspecified atrial fibrillation: Secondary | ICD-10-CM | POA: Diagnosis not present

## 2016-02-19 DIAGNOSIS — R195 Other fecal abnormalities: Secondary | ICD-10-CM | POA: Diagnosis not present

## 2016-02-19 DIAGNOSIS — F329 Major depressive disorder, single episode, unspecified: Secondary | ICD-10-CM | POA: Diagnosis not present

## 2016-02-19 DIAGNOSIS — I10 Essential (primary) hypertension: Secondary | ICD-10-CM | POA: Insufficient documentation

## 2016-02-19 DIAGNOSIS — D120 Benign neoplasm of cecum: Secondary | ICD-10-CM | POA: Diagnosis not present

## 2016-02-19 HISTORY — PX: COLONOSCOPY: SHX5424

## 2016-02-19 HISTORY — DX: Major depressive disorder, single episode, unspecified: F32.9

## 2016-02-19 HISTORY — PX: POLYPECTOMY: SHX5525

## 2016-02-19 HISTORY — DX: Polyp of colon: K63.5

## 2016-02-19 HISTORY — DX: Depression, unspecified: F32.A

## 2016-02-19 HISTORY — DX: Unspecified atrial fibrillation: I48.91

## 2016-02-19 SURGERY — COLONOSCOPY
Anesthesia: Moderate Sedation

## 2016-02-19 MED ORDER — SODIUM CHLORIDE 0.9 % IV SOLN
INTRAVENOUS | Status: DC
  Administered 2016-02-19: 11:00:00 via INTRAVENOUS

## 2016-02-19 MED ORDER — MIDAZOLAM HCL 5 MG/5ML IJ SOLN
INTRAMUSCULAR | Status: DC | PRN
  Administered 2016-02-19: 2 mg via INTRAVENOUS
  Administered 2016-02-19 (×2): 1 mg via INTRAVENOUS

## 2016-02-19 MED ORDER — STERILE WATER FOR IRRIGATION IR SOLN
Status: DC | PRN
  Administered 2016-02-19: 11:00:00

## 2016-02-19 MED ORDER — MEPERIDINE HCL 50 MG/ML IJ SOLN
INTRAMUSCULAR | Status: AC
  Filled 2016-02-19: qty 1

## 2016-02-19 MED ORDER — MEPERIDINE HCL 50 MG/ML IJ SOLN
INTRAMUSCULAR | Status: DC | PRN
  Administered 2016-02-19: 25 mg via INTRAVENOUS

## 2016-02-19 MED ORDER — MIDAZOLAM HCL 5 MG/5ML IJ SOLN
INTRAMUSCULAR | Status: AC
  Filled 2016-02-19: qty 10

## 2016-02-19 NOTE — Discharge Instructions (Signed)
Resume usual medications including warfarin starting this evening. Resume usual diet. No driving for 24 hours. INR check in 10 days. Physician will call with biopsy results.       Colonoscopy, Adult, Care After This sheet gives you information about how to care for yourself after your procedure. Your doctor may also give you more specific instructions. If you have problems or questions, call your doctor. Follow these instructions at home: General instructions  For the first 24 hours after the procedure:  Do not drive or use machinery.  Do not sign important documents.  Do not drink alcohol.  Do your daily activities more slowly than normal.  Eat foods that are soft and easy to digest.  Rest often.  Take over-the-counter or prescription medicines only as told by your doctor.  It is up to you to get the results of your procedure. Ask your doctor, or the department performing the procedure, when your results will be ready. To help cramping and bloating:  Try walking around.  Put heat on your belly (abdomen) as told by your doctor. Use a heat source that your doctor recommends, such as a moist heat pack or a heating pad.  Put a towel between your skin and the heat source.  Leave the heat on for 20-30 minutes.  Remove the heat if your skin turns bright red. This is especially important if you cannot feel pain, heat, or cold. You can get burned. Eating and drinking  Drink enough fluid to keep your pee (urine) clear or pale yellow.  Return to your normal diet as told by your doctor. Avoid heavy or fried foods that are hard to digest.  Avoid drinking alcohol for as long as told by your doctor. Contact a doctor if:  You have blood in your poop (stool) 2-3 days after the procedure. Get help right away if:  You have more than a small amount of blood in your poop.  You see large clumps of tissue (blood clots) in your poop.  Your belly is swollen.  You feel sick to  your stomach (nauseous).  You throw up (vomit).  You have a fever.  You have belly pain that gets worse, and medicine does not help your pain. This information is not intended to replace advice given to you by your health care provider. Make sure you discuss any questions you have with your health care provider. Document Released: 04/23/2010 Document Revised: 12/14/2015 Document Reviewed: 12/14/2015 Elsevier Interactive Patient Education  2017 [employer].    Colon Polyps Introduction Polyps are tissue growths inside the body. Polyps can grow in many places, including the large intestine (colon). A polyp may be a round bump or a mushroom-shaped growth. You could have one polyp or several. Most colon polyps are noncancerous (benign). However, some colon polyps can become cancerous over time. What are the causes? The exact cause of colon polyps is not known. What increases the risk? This condition is more likely to develop in people who:  Have a family history of colon cancer or colon polyps.  Are older than 49 or older than 45 if they are African American.  Have inflammatory bowel disease, such as ulcerative colitis or Crohn disease.  Are overweight.  Smoke cigarettes.  Do not get enough exercise.  Drink too much alcohol.  Eat a diet that is:  High in fat and red meat.  Low in fiber.  Had childhood cancer that was treated with abdominal radiation. What are the signs or  symptoms? Most polyps do not cause symptoms. If you have symptoms, they may include:  Blood coming from your rectum when having a bowel movement.  Blood in your stool.The stool may look dark red or black.  A change in bowel habits, such as constipation or diarrhea. How is this diagnosed? This condition is diagnosed with a colonoscopy. This is a procedure that uses a lighted, flexible scope to look at the inside of your colon. How is this treated? Treatment for this condition involves removing any  polyps that are found. Those polyps will then be tested for cancer. If cancer is found, your health care provider will talk to you about options for colon cancer treatment. Follow these instructions at home: Diet  Eat plenty of fiber, such as fruits, vegetables, and whole grains.  Eat foods that are high in calcium and vitamin D, such as milk, cheese, yogurt, eggs, liver, fish, and broccoli.  Limit foods high in fat, red meats, and processed meats, such as hot dogs, sausage, bacon, and lunch meats.  Maintain a healthy weight, or lose weight if recommended by your health care provider. General instructions  Do not smoke cigarettes.  Do not drink alcohol excessively.  Keep all follow-up visits as told by your health care provider. This is important. This includes keeping regularly scheduled colonoscopies. Talk to your health care provider about when you need a colonoscopy.  Exercise every day or as told by your health care provider. Contact a health care provider if:  You have new or worsening bleeding during a bowel movement.  You have new or increased blood in your stool.  You have a change in bowel habits.  You unexpectedly lose weight. This information is not intended to replace advice given to you by your health care provider. Make sure you discuss any questions you have with your health care provider. Document Released: 12/16/2003 Document Revised: 08/27/2015 Document Reviewed: 02/09/2015  2017 Elsevier

## 2016-02-19 NOTE — Op Note (Signed)
[facility] Patient Name: Raymond Nunez Procedure Date: 02/19/2016 11:14 AM MRN: [id number] Date of Birth: [birth year] Attending MD: Hildred Laser , MD CSN: [id number] Age: 82 Admit Type: Outpatient Procedure:                Colonoscopy Indications:              Heme positive stool, Follow-up for history of colon                            polyps Providers:                Hildred Laser, MD, Otis Peak B. Sharon Seller, RN, Sherlyn Lees, Technician Referring MD:             Mitzie Na. [name], MD Medicines:                Meperidine 25 mg IV, Midazolam 4 mg IV Complications:            No immediate complications. Estimated Blood Loss:     Estimated blood loss was minimal. Procedure:                Pre-Anesthesia Assessment:                           - Prior to the procedure, a History and Physical                            was performed, and patient medications and                            allergies were reviewed. The patient's tolerance of                            previous anesthesia was also reviewed. The risks                            and benefits of the procedure and the sedation                            options and risks were discussed with the patient.                            All questions were answered, and informed consent                            was obtained. Prior Anticoagulants: The patient                            last took Coumadin (warfarin) 5 days prior to the                            procedure. ASA Grade Assessment: III - A patient  with severe systemic disease. After reviewing the                            risks and benefits, the patient was deemed in                            satisfactory condition to undergo the procedure.                           After obtaining informed consent, the colonoscope                            was passed under direct vision. Throughout the   procedure, the patient's blood pressure, pulse, and                            oxygen saturations were monitored continuously. The                            [id number]) scope was introduced through                            the anus and advanced to the the cecum, identified                            by appendiceal orifice and ileocecal valve. The                            colonoscopy was performed without difficulty. The                            patient tolerated the procedure well. The quality                            of the bowel preparation was adequate to identify                            polyps. The ileocecal valve, appendiceal orifice,                            and rectum were photographed. Scope In: 11:26:00 AM Scope Out: 11:56:37 AM Scope Withdrawal Time: 0 hours 25 minutes 25 seconds  Total Procedure Duration: 0 hours 30 minutes 37 seconds  Findings:      A 4 mm polyp was found in the cecum. The polyp was sessile. Biopsies       were taken with a cold forceps for histology. The pathology specimen was       placed into Bottle Number 1.      A 8 mm polyp was found in the ascending colon. The polyp was sessile. To       prevent bleeding after the polypectomy, two hemostatic clips were       successfully placed (MR conditional). There was no bleeding at the end       of the procedure.      External hemorrhoids were found during  retroflexion. The hemorrhoids       were small. Impression:               - One 4 mm polyp in the cecum. Biopsied.                           - One 8 mm polyp in the ascending colon. Clips (MR                            conditional) were placed.                           - External hemorrhoids. Moderate Sedation:      Moderate (conscious) sedation was administered by the endoscopy nurse       and supervised by the endoscopist. The following parameters were       monitored: oxygen saturation, heart rate, blood pressure, CO2        capnography and response to care. Total physician intraservice time was       34 minutes. Recommendation:           - Patient has a contact number available for                            emergencies. The signs and symptoms of potential                            delayed complications were discussed with the                            patient. Return to normal activities tomorrow.                            Written discharge instructions were provided to the                            patient.                           - Resume previous diet today.                           - Continue present medications.                           - Resume Coumadin (warfarin) at prior dose today.                           - Await pathology results.                           - No recommendation at this time regarding repeat                            colonoscopy. Procedure Code(s):        --- Professional ---  [id number], Colonoscopy, flexible; with biopsy, single                            or multiple                           99152, Moderate sedation services provided by the                            same physician or other qualified health care                            professional performing the diagnostic or                            therapeutic service that the sedation supports,                            requiring the presence of an independent trained                            observer to assist in the monitoring of the                            patient's level of consciousness and physiological                            status; initial 15 minutes of intraservice time,                            patient age 40 years or older                           [phone], Moderate sedation services; each additional                            15 minutes intraservice time Diagnosis Code(s):        --- Professional ---                           D12.0, Benign neoplasm of cecum                            D12.2, Benign neoplasm of ascending colon                           K64.4, Residual hemorrhoidal skin tags                           R19.5, Other fecal abnormalities                           Z86.010, Personal history of colonic polyps CPT copyright 2016 American Medical Association. All rights reserved. The codes documented in this report are preliminary and upon coder review may  be revised to meet current compliance requirements. [employer],  MD Hildred Laser, MD 02/19/2016 12:06:15 PM This report has been signed electronically. Number of Addenda: 0

## 2016-02-19 NOTE — H&P (Signed)
Raymond Nunez is an 82 y.o. male.   Chief Complaint: Patient is here for colonoscopy. HPI: Patient is 82 year old Caucasian male who was history of colonic adenomas whose last colonoscopy was in November 2012. He was noted to have heme-positive stool. He denies rectal bleeding abdominal pain change in bowel habits. Last warfarin dose was on 02/10/2016. Family history is negative for CRC.  Past Medical History:  Diagnosis Date  . Atrial fibrillation ([place])   . Colon polyps   . Depression   . Dysrhythmia   . Hypertension   . Rectal bleeding     Past Surgical History:  Procedure Laterality Date  . CHOLECYSTECTOMY    . COLONOSCOPY  02/03/2011   Procedure: COLONOSCOPY;  Surgeon: Rogene Houston, MD;  Location: [facility];  Service: Endoscopy;  Laterality: N/A;    No family history on file. Social History:  reports that he quit smoking about 34 years ago. His smoking use included Cigarettes. He has a 25.00 pack-year smoking history. He has never used smokeless tobacco. He reports that he does not drink alcohol or use drugs.  Allergies: No Known Allergies  Medications Prior to Admission  Medication Sig Dispense Refill  . acetaminophen (TYLENOL) 325 MG tablet Take 650 mg by mouth every 6 (six) hours as needed. For back pain.     Marland Kitchen allopurinol (ZYLOPRIM) 300 MG tablet Take 300 mg by mouth daily.      . cholecalciferol (VITAMIN D) 1000 UNITS tablet Take 1,000 Units by mouth daily.      . citalopram (CELEXA) 20 MG tablet Take 20 mg by mouth daily.      Marland Kitchen diltiazem (CARDIZEM CD) 240 MG 24 hr capsule Take 240 mg by mouth daily.      . fish oil-omega-3 fatty acids 1000 MG capsule Take 1 g by mouth 2 (two) times daily.     Marland Kitchen GLUCOSAMINE PO Take 2,000 mg by mouth daily.      Marland Kitchen losartan-hydrochlorothiazide (HYZAAR) 50-12.5 MG per tablet Take 1 tablet by mouth.      . metoprolol (LOPRESSOR) 50 MG tablet Take 100 mg by mouth daily.     . psyllium (METAMUCIL) 58.6 % packet Take 1 packet by mouth  daily.    . ranitidine (ZANTAC) 75 MG tablet Take 75 mg by mouth 2 (two) times daily.      . traZODone (DESYREL) 50 MG tablet Take 50 mg by mouth at bedtime.    . vitamin B-12 (CYANOCOBALAMIN) 1000 MCG tablet Take 1,000 mcg by mouth daily.      Marland Kitchen warfarin (COUMADIN) 2 MG tablet Take 1 tablet (2 mg total) by mouth daily. (Patient taking differently: Take 3 mg by mouth daily. Takes 2 mg one day and the next 1 mg.) 30 tablet 1    No results found for this or any previous visit (from the past 48 hour(s)). No results found.  ROS  Blood pressure (!) 138/97, pulse 90, temperature 97.5 F (36.4 C), temperature source Oral, resp. rate 16, height 5\' 9"  (1.753 m), weight 244 lb (110.7 kg), SpO2 100 %. Physical Exam  Constitutional: He appears well-developed and well-nourished.  HENT:  Mouth/Throat: Oropharynx is clear and moist.  Eyes: Conjunctivae are normal. No scleral icterus.  Neck: No thyromegaly present.  Cardiovascular:  Irregular rhythm normal S1 and S2. No murmur or gallop noted.  Respiratory: Effort normal and breath sounds normal.  GI:  Abdomen is full but soft and nontender without organomegaly or masses.  Musculoskeletal: He  exhibits no edema.  Lymphadenopathy:    He has no cervical adenopathy.  Neurological: He is alert.  Skin: Skin is warm and dry.     Assessment/Plan Heme positive stool. History of colonic adenomas. Diagnostic colonoscopy.  Hildred Laser, MD 02/19/2016, 11:16 AM

## 2016-02-22 ENCOUNTER — Encounter (HOSPITAL_COMMUNITY): Payer: Self-pay | Admitting: Internal Medicine

## 2016-03-02 DIAGNOSIS — I352 Nonrheumatic aortic (valve) stenosis with insufficiency: Secondary | ICD-10-CM | POA: Diagnosis not present

## 2016-03-08 DIAGNOSIS — I482 Chronic atrial fibrillation: Secondary | ICD-10-CM | POA: Diagnosis not present

## 2016-03-14 DIAGNOSIS — J209 Acute bronchitis, unspecified: Secondary | ICD-10-CM | POA: Diagnosis not present

## 2016-03-14 DIAGNOSIS — J449 Chronic obstructive pulmonary disease, unspecified: Secondary | ICD-10-CM | POA: Diagnosis not present

## 2016-03-14 DIAGNOSIS — I482 Chronic atrial fibrillation: Secondary | ICD-10-CM | POA: Diagnosis not present

## 2016-03-23 DIAGNOSIS — J209 Acute bronchitis, unspecified: Secondary | ICD-10-CM | POA: Diagnosis not present

## 2016-04-05 DIAGNOSIS — I352 Nonrheumatic aortic (valve) stenosis with insufficiency: Secondary | ICD-10-CM | POA: Diagnosis not present

## 2016-05-02 DIAGNOSIS — J4530 Mild persistent asthma, uncomplicated: Secondary | ICD-10-CM | POA: Diagnosis not present

## 2016-05-04 DIAGNOSIS — I482 Chronic atrial fibrillation: Secondary | ICD-10-CM | POA: Diagnosis not present

## 2016-05-04 DIAGNOSIS — I352 Nonrheumatic aortic (valve) stenosis with insufficiency: Secondary | ICD-10-CM | POA: Diagnosis not present

## 2016-05-18 DIAGNOSIS — M1000 Idiopathic gout, unspecified site: Secondary | ICD-10-CM | POA: Diagnosis not present

## 2016-05-18 DIAGNOSIS — R05 Cough: Secondary | ICD-10-CM | POA: Diagnosis not present

## 2016-05-18 DIAGNOSIS — Z9189 Other specified personal risk factors, not elsewhere classified: Secondary | ICD-10-CM | POA: Diagnosis not present

## 2016-05-18 DIAGNOSIS — I482 Chronic atrial fibrillation: Secondary | ICD-10-CM | POA: Diagnosis not present

## 2016-05-18 DIAGNOSIS — E782 Mixed hyperlipidemia: Secondary | ICD-10-CM | POA: Diagnosis not present

## 2016-05-18 DIAGNOSIS — J158 Pneumonia due to other specified bacteria: Secondary | ICD-10-CM | POA: Diagnosis not present

## 2016-05-18 DIAGNOSIS — I10 Essential (primary) hypertension: Secondary | ICD-10-CM | POA: Diagnosis not present

## 2016-05-18 DIAGNOSIS — R7302 Impaired glucose tolerance (oral): Secondary | ICD-10-CM | POA: Diagnosis not present

## 2016-05-25 DIAGNOSIS — I10 Essential (primary) hypertension: Secondary | ICD-10-CM | POA: Diagnosis not present

## 2016-05-25 DIAGNOSIS — E782 Mixed hyperlipidemia: Secondary | ICD-10-CM | POA: Diagnosis not present

## 2016-05-26 DIAGNOSIS — L570 Actinic keratosis: Secondary | ICD-10-CM | POA: Diagnosis not present

## 2016-05-26 DIAGNOSIS — C44319 Basal cell carcinoma of skin of other parts of face: Secondary | ICD-10-CM | POA: Diagnosis not present

## 2016-05-26 DIAGNOSIS — X32XXXD Exposure to sunlight, subsequent encounter: Secondary | ICD-10-CM | POA: Diagnosis not present

## 2016-06-08 DIAGNOSIS — J158 Pneumonia due to other specified bacteria: Secondary | ICD-10-CM | POA: Diagnosis not present

## 2016-06-16 DIAGNOSIS — I352 Nonrheumatic aortic (valve) stenosis with insufficiency: Secondary | ICD-10-CM | POA: Diagnosis not present

## 2016-07-18 DIAGNOSIS — I352 Nonrheumatic aortic (valve) stenosis with insufficiency: Secondary | ICD-10-CM | POA: Diagnosis not present

## 2016-08-02 DIAGNOSIS — I5032 Chronic diastolic (congestive) heart failure: Secondary | ICD-10-CM | POA: Diagnosis not present

## 2016-08-09 DIAGNOSIS — I509 Heart failure, unspecified: Secondary | ICD-10-CM | POA: Diagnosis not present

## 2016-08-09 DIAGNOSIS — I2510 Atherosclerotic heart disease of native coronary artery without angina pectoris: Secondary | ICD-10-CM | POA: Diagnosis not present

## 2016-08-09 DIAGNOSIS — I350 Nonrheumatic aortic (valve) stenosis: Secondary | ICD-10-CM | POA: Diagnosis not present

## 2016-08-09 DIAGNOSIS — R0602 Shortness of breath: Secondary | ICD-10-CM | POA: Diagnosis not present

## 2016-08-09 DIAGNOSIS — I5032 Chronic diastolic (congestive) heart failure: Secondary | ICD-10-CM | POA: Diagnosis not present

## 2016-08-09 DIAGNOSIS — R918 Other nonspecific abnormal finding of lung field: Secondary | ICD-10-CM | POA: Diagnosis not present

## 2016-08-15 DIAGNOSIS — I5032 Chronic diastolic (congestive) heart failure: Secondary | ICD-10-CM | POA: Diagnosis not present

## 2016-08-30 DIAGNOSIS — I482 Chronic atrial fibrillation: Secondary | ICD-10-CM | POA: Diagnosis not present

## 2016-09-09 ENCOUNTER — Institutional Professional Consult (permissible substitution): Payer: Medicare Other | Admitting: Internal Medicine

## 2016-09-21 DIAGNOSIS — E782 Mixed hyperlipidemia: Secondary | ICD-10-CM | POA: Diagnosis not present

## 2016-09-21 DIAGNOSIS — I10 Essential (primary) hypertension: Secondary | ICD-10-CM | POA: Diagnosis not present

## 2016-09-21 DIAGNOSIS — I5032 Chronic diastolic (congestive) heart failure: Secondary | ICD-10-CM | POA: Diagnosis not present

## 2016-09-26 DIAGNOSIS — E782 Mixed hyperlipidemia: Secondary | ICD-10-CM | POA: Diagnosis not present

## 2016-09-26 DIAGNOSIS — Z1389 Encounter for screening for other disorder: Secondary | ICD-10-CM | POA: Diagnosis not present

## 2016-09-26 DIAGNOSIS — I10 Essential (primary) hypertension: Secondary | ICD-10-CM | POA: Diagnosis not present

## 2016-09-28 IMAGING — CT CT CHEST W/ CM
2 of 3 series · 15 of 36 positions shown, 18 images · IV contrast (omnipaque)
Comparison: Two-view chest x-ray 02/11/2015

CLINICAL DATA: Chest pain.  Shortness of breath.

EXAM:
CT CHEST WITH CONTRAST
TECHNIQUE: Multidetector CT imaging of the chest was performed during
intravenous contrast administration.
CONTRAST:  75mL OMNIPAQUE IOHEXOL 300 MG/ML  SOLN

[Series 2: chestroutine 5.0 b40f · axial · 0.80mm/px · z∈[-309,-19]mm · 12 of 70 slices shown, 15 images]
[im 6/70  mediastinal]
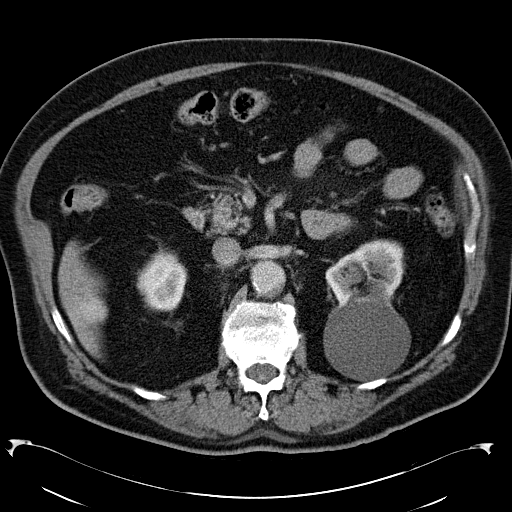
[im 6/70  lung]
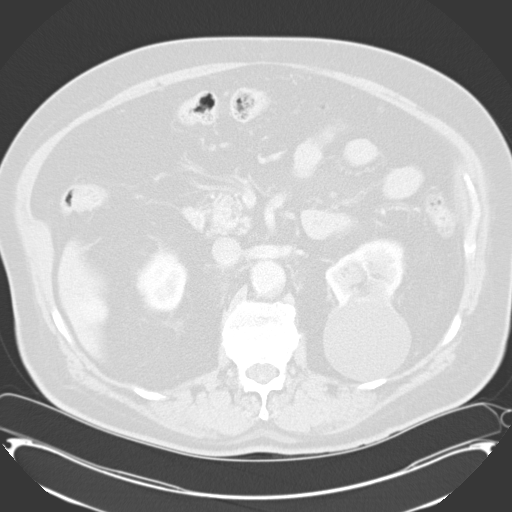
[im 11/70  lung]
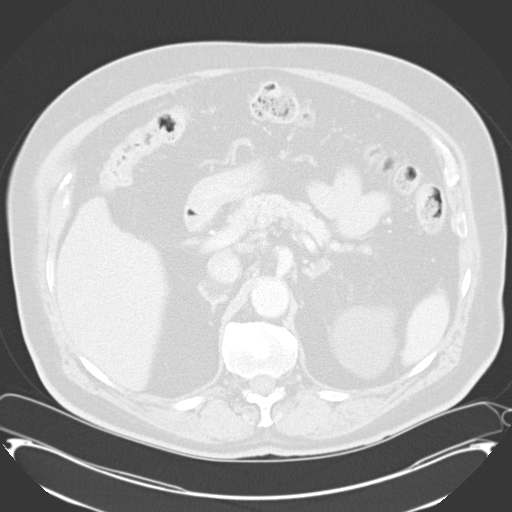
[im 16/70  lung]
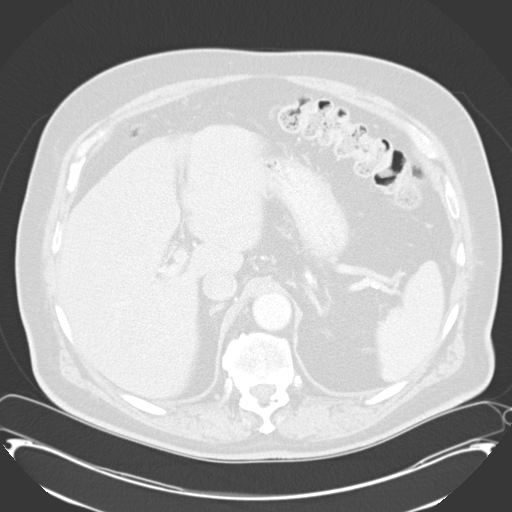
[im 21/70  lung]
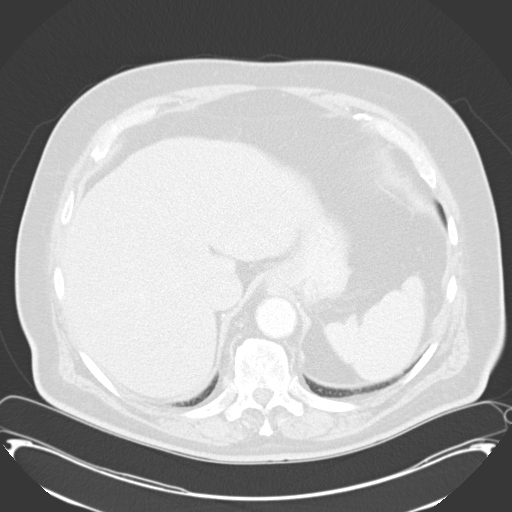
[im 26/70  mediastinal]
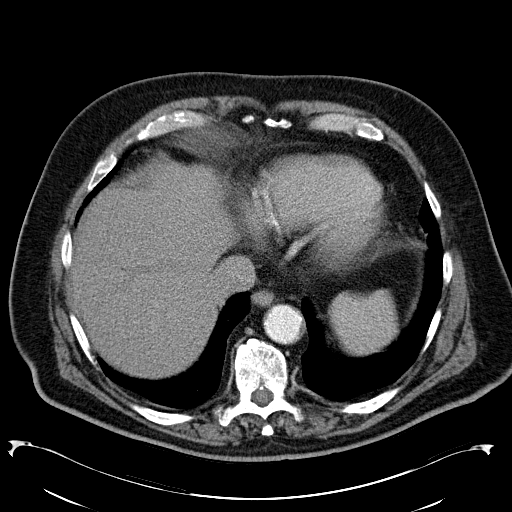
[im 26/70  lung]
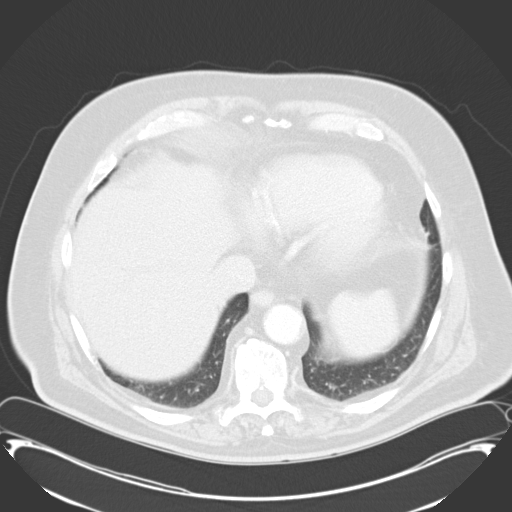
[im 31/70  lung]
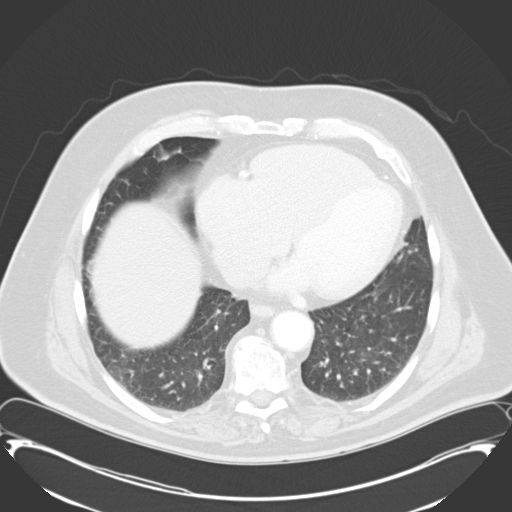
[im 39/70  lung]
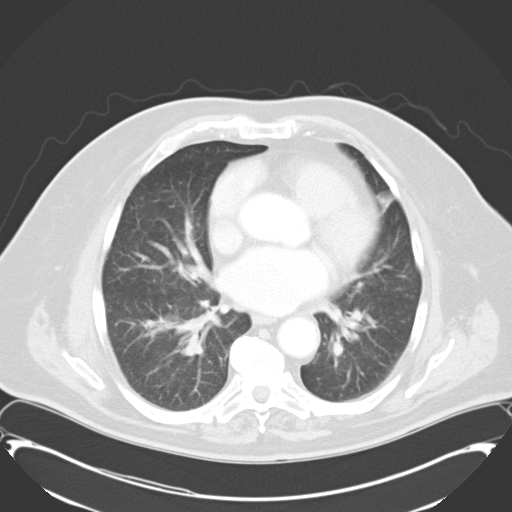
[im 44/70  lung]
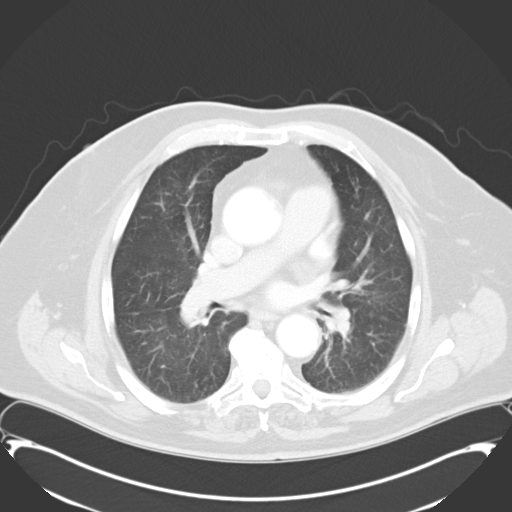
[im 49/70  mediastinal]
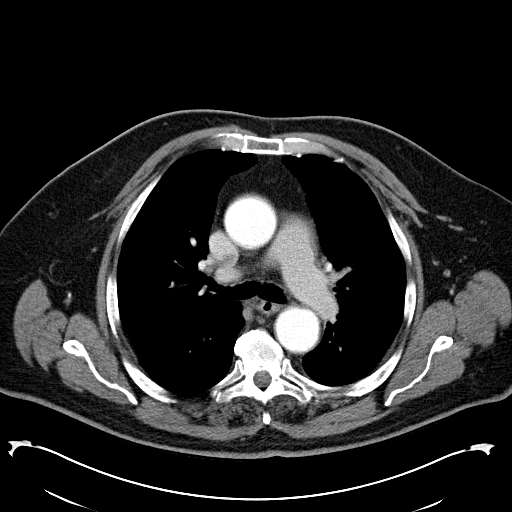
[im 49/70  lung]
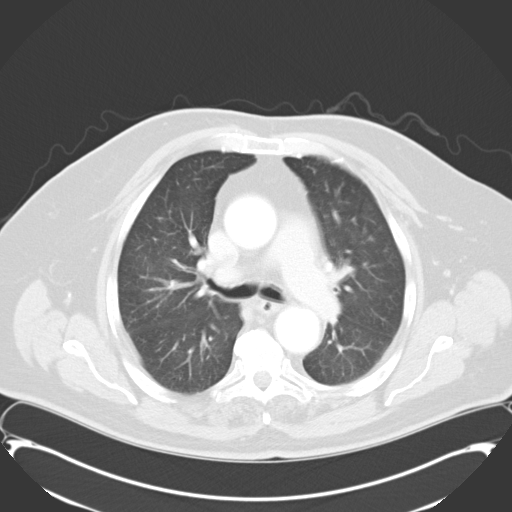
[im 54/70  lung]
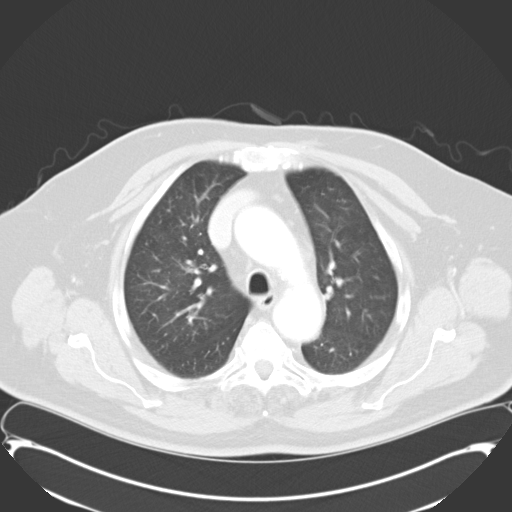
[im 59/70  lung]
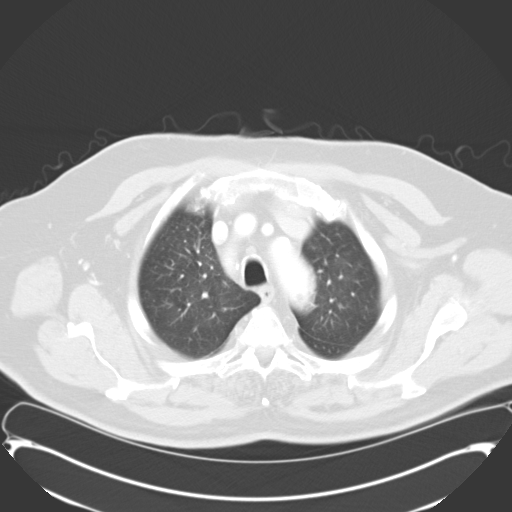
[im 64/70  lung]
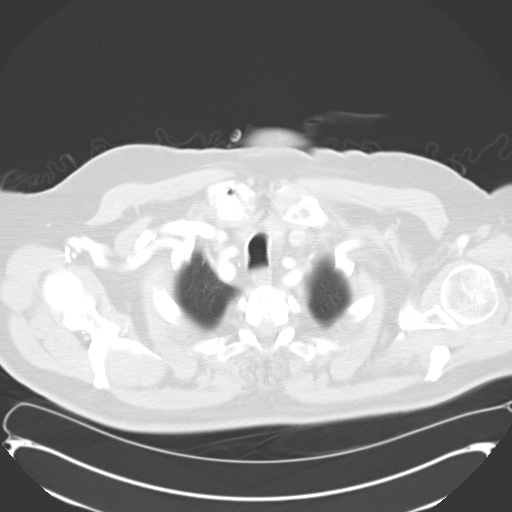

[Series 4: mpr coro 3mm · coronal · 0.67mm/px · 3 of 108 slices shown]
[im 22/108  lung]
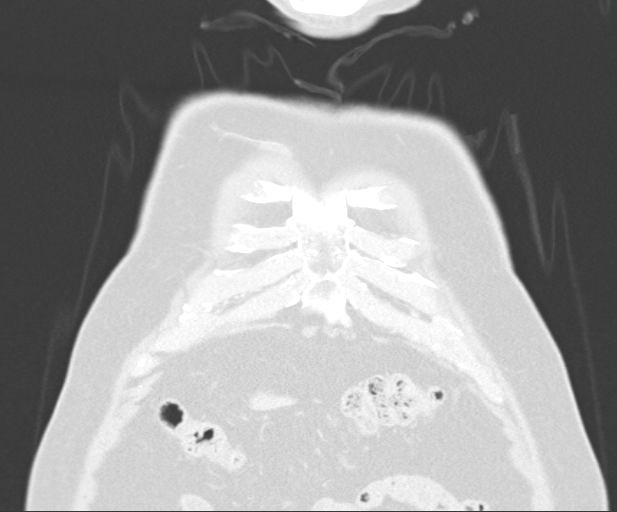
[im 43/108  lung]
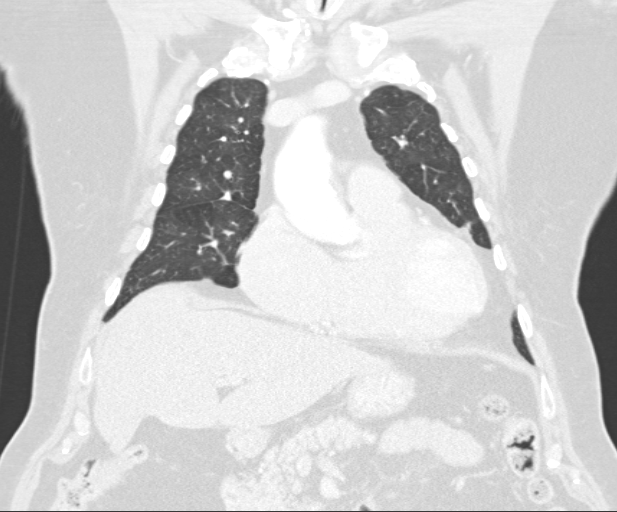
[im 65/108  lung]
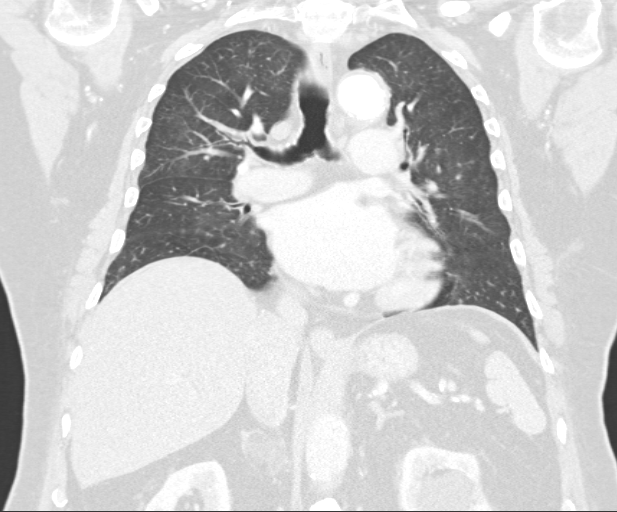

[15 of 36 positions shown; findings below may reference images not displayed]

FINDINGS: Mediastinum/Lymph Nodes: The heart size is normal. Coronary artery
calcifications are present. No significant mediastinal or axillary
adenopathy is present. Atherosclerotic calcifications are present at
the origins the great vessels without stenosis. The thoracic inlet
is unremarkable. The esophagus is within normal limits.

Lungs/Pleura: Mild diffuse ground-glass attenuation is present. A 5
mm pleural-based nodule is evident on image number 24. A 6 mm
pleural-based nodule is evident on image 31. There may be some
mucoid impaction. No focal airspace consolidation or mass lesion is
present.

Upper abdomen: A prominent upper pole cyst is present at the left
kidney. A fat containing lesion is present in the right adrenal
gland compatible with a mile lipoma. A small adrenal adenoma is
present on the left. There is fatty infiltration of the liver.
Minimal nodularity suggests early cirrhosis.

Musculoskeletal: Fusion of anterior osteophytes is present across
multiple levels in the thoracic spine. Vertebral body heights and
alignment are otherwise maintained.
IMPRESSION: 1. Mild prominence of the distal vascularity and slight ground-glass
attenuation suggesting mild edema.
2. Pleural-based nodules as above. Given the location, these are
likely benign. If the patient is at high risk for bronchogenic
carcinoma, follow-up chest CT at 6-12 months is recommended. If the
patient is at low risk for bronchogenic carcinoma, follow-up chest
CT at 12 months is recommended. This recommendation follows the
consensus statement: Guidelines for Management of Small Pulmonary
Nodules Detected on CT Scans: A Statement from the [employer]
3. Atherosclerotic disease including coronary artery disease.
4. Fusion of anterior osteophytes compatible with DISH.

## 2016-10-04 DIAGNOSIS — I482 Chronic atrial fibrillation: Secondary | ICD-10-CM | POA: Diagnosis not present

## 2016-11-08 DIAGNOSIS — I482 Chronic atrial fibrillation: Secondary | ICD-10-CM | POA: Diagnosis not present

## 2016-12-13 DIAGNOSIS — I482 Chronic atrial fibrillation: Secondary | ICD-10-CM | POA: Diagnosis not present

## 2016-12-27 DIAGNOSIS — J0100 Acute maxillary sinusitis, unspecified: Secondary | ICD-10-CM | POA: Diagnosis not present

## 2016-12-27 DIAGNOSIS — K0889 Other specified disorders of teeth and supporting structures: Secondary | ICD-10-CM | POA: Diagnosis not present

## 2017-01-11 DIAGNOSIS — I10 Essential (primary) hypertension: Secondary | ICD-10-CM | POA: Diagnosis not present

## 2017-01-11 DIAGNOSIS — E782 Mixed hyperlipidemia: Secondary | ICD-10-CM | POA: Diagnosis not present

## 2017-01-11 DIAGNOSIS — I482 Chronic atrial fibrillation: Secondary | ICD-10-CM | POA: Diagnosis not present

## 2017-01-11 DIAGNOSIS — M1000 Idiopathic gout, unspecified site: Secondary | ICD-10-CM | POA: Diagnosis not present

## 2017-01-17 DIAGNOSIS — Z1212 Encounter for screening for malignant neoplasm of rectum: Secondary | ICD-10-CM | POA: Diagnosis not present

## 2017-01-17 DIAGNOSIS — Z0001 Encounter for general adult medical examination with abnormal findings: Secondary | ICD-10-CM | POA: Diagnosis not present

## 2017-01-17 DIAGNOSIS — Z23 Encounter for immunization: Secondary | ICD-10-CM | POA: Diagnosis not present

## 2017-01-17 DIAGNOSIS — I352 Nonrheumatic aortic (valve) stenosis with insufficiency: Secondary | ICD-10-CM | POA: Diagnosis not present

## 2017-01-17 DIAGNOSIS — I482 Chronic atrial fibrillation: Secondary | ICD-10-CM | POA: Diagnosis not present

## 2017-01-17 DIAGNOSIS — I10 Essential (primary) hypertension: Secondary | ICD-10-CM | POA: Diagnosis not present

## 2017-01-17 DIAGNOSIS — J449 Chronic obstructive pulmonary disease, unspecified: Secondary | ICD-10-CM | POA: Diagnosis not present

## 2017-01-24 DIAGNOSIS — I482 Chronic atrial fibrillation: Secondary | ICD-10-CM | POA: Diagnosis not present

## 2017-01-24 DIAGNOSIS — I352 Nonrheumatic aortic (valve) stenosis with insufficiency: Secondary | ICD-10-CM | POA: Diagnosis not present

## 2017-01-24 DIAGNOSIS — Z0001 Encounter for general adult medical examination with abnormal findings: Secondary | ICD-10-CM | POA: Diagnosis not present

## 2017-03-02 DIAGNOSIS — I482 Chronic atrial fibrillation: Secondary | ICD-10-CM | POA: Diagnosis not present

## 2017-03-02 DIAGNOSIS — I352 Nonrheumatic aortic (valve) stenosis with insufficiency: Secondary | ICD-10-CM | POA: Diagnosis not present

## 2017-04-04 HISTORY — PX: CARPAL TUNNEL RELEASE: SHX101

## 2017-04-14 DIAGNOSIS — I482 Chronic atrial fibrillation: Secondary | ICD-10-CM | POA: Diagnosis not present

## 2017-04-14 DIAGNOSIS — I352 Nonrheumatic aortic (valve) stenosis with insufficiency: Secondary | ICD-10-CM | POA: Diagnosis not present

## 2017-05-19 DIAGNOSIS — I352 Nonrheumatic aortic (valve) stenosis with insufficiency: Secondary | ICD-10-CM | POA: Diagnosis not present

## 2017-05-19 DIAGNOSIS — I5032 Chronic diastolic (congestive) heart failure: Secondary | ICD-10-CM | POA: Diagnosis not present

## 2017-06-02 DIAGNOSIS — I482 Chronic atrial fibrillation: Secondary | ICD-10-CM | POA: Diagnosis not present

## 2017-07-18 DIAGNOSIS — E782 Mixed hyperlipidemia: Secondary | ICD-10-CM | POA: Diagnosis not present

## 2017-07-18 DIAGNOSIS — I10 Essential (primary) hypertension: Secondary | ICD-10-CM | POA: Diagnosis not present

## 2017-07-18 DIAGNOSIS — I482 Chronic atrial fibrillation: Secondary | ICD-10-CM | POA: Diagnosis not present

## 2017-07-18 DIAGNOSIS — J449 Chronic obstructive pulmonary disease, unspecified: Secondary | ICD-10-CM | POA: Diagnosis not present

## 2017-07-25 DIAGNOSIS — I2510 Atherosclerotic heart disease of native coronary artery without angina pectoris: Secondary | ICD-10-CM | POA: Diagnosis not present

## 2017-07-25 DIAGNOSIS — I352 Nonrheumatic aortic (valve) stenosis with insufficiency: Secondary | ICD-10-CM | POA: Diagnosis not present

## 2017-07-25 DIAGNOSIS — I517 Cardiomegaly: Secondary | ICD-10-CM | POA: Diagnosis not present

## 2017-07-25 DIAGNOSIS — I700 Atherosclerosis of aorta: Secondary | ICD-10-CM | POA: Diagnosis not present

## 2017-07-25 DIAGNOSIS — I351 Nonrheumatic aortic (valve) insufficiency: Secondary | ICD-10-CM | POA: Diagnosis not present

## 2017-07-25 DIAGNOSIS — I482 Chronic atrial fibrillation: Secondary | ICD-10-CM | POA: Diagnosis not present

## 2017-08-22 DIAGNOSIS — I482 Chronic atrial fibrillation: Secondary | ICD-10-CM | POA: Diagnosis not present

## 2017-09-19 DIAGNOSIS — I10 Essential (primary) hypertension: Secondary | ICD-10-CM | POA: Diagnosis not present

## 2017-09-19 DIAGNOSIS — E782 Mixed hyperlipidemia: Secondary | ICD-10-CM | POA: Diagnosis not present

## 2017-09-19 DIAGNOSIS — J449 Chronic obstructive pulmonary disease, unspecified: Secondary | ICD-10-CM | POA: Diagnosis not present

## 2017-09-19 DIAGNOSIS — I482 Chronic atrial fibrillation: Secondary | ICD-10-CM | POA: Diagnosis not present

## 2017-09-19 DIAGNOSIS — N401 Enlarged prostate with lower urinary tract symptoms: Secondary | ICD-10-CM | POA: Diagnosis not present

## 2017-09-26 DIAGNOSIS — I482 Chronic atrial fibrillation: Secondary | ICD-10-CM | POA: Diagnosis not present

## 2017-10-10 DIAGNOSIS — G5601 Carpal tunnel syndrome, right upper limb: Secondary | ICD-10-CM | POA: Diagnosis not present

## 2017-10-10 DIAGNOSIS — M79641 Pain in right hand: Secondary | ICD-10-CM | POA: Diagnosis not present

## 2017-10-10 DIAGNOSIS — R209 Unspecified disturbances of skin sensation: Secondary | ICD-10-CM | POA: Diagnosis not present

## 2017-10-10 DIAGNOSIS — G5602 Carpal tunnel syndrome, left upper limb: Secondary | ICD-10-CM | POA: Diagnosis not present

## 2017-10-12 DIAGNOSIS — G5602 Carpal tunnel syndrome, left upper limb: Secondary | ICD-10-CM | POA: Diagnosis not present

## 2017-10-12 DIAGNOSIS — G5601 Carpal tunnel syndrome, right upper limb: Secondary | ICD-10-CM | POA: Diagnosis not present

## 2017-10-31 DIAGNOSIS — I482 Chronic atrial fibrillation: Secondary | ICD-10-CM | POA: Diagnosis not present

## 2017-11-16 DIAGNOSIS — G5601 Carpal tunnel syndrome, right upper limb: Secondary | ICD-10-CM | POA: Diagnosis not present

## 2017-11-29 DIAGNOSIS — Z87891 Personal history of nicotine dependence: Secondary | ICD-10-CM | POA: Diagnosis not present

## 2017-11-29 DIAGNOSIS — M109 Gout, unspecified: Secondary | ICD-10-CM | POA: Diagnosis not present

## 2017-11-29 DIAGNOSIS — G5601 Carpal tunnel syndrome, right upper limb: Secondary | ICD-10-CM | POA: Diagnosis not present

## 2017-11-29 DIAGNOSIS — I4891 Unspecified atrial fibrillation: Secondary | ICD-10-CM | POA: Diagnosis not present

## 2017-11-29 DIAGNOSIS — Z9049 Acquired absence of other specified parts of digestive tract: Secondary | ICD-10-CM | POA: Diagnosis not present

## 2017-11-29 DIAGNOSIS — I10 Essential (primary) hypertension: Secondary | ICD-10-CM | POA: Diagnosis not present

## 2017-11-29 DIAGNOSIS — Z86010 Personal history of colonic polyps: Secondary | ICD-10-CM | POA: Diagnosis not present

## 2017-11-29 DIAGNOSIS — M1990 Unspecified osteoarthritis, unspecified site: Secondary | ICD-10-CM | POA: Diagnosis not present

## 2017-11-29 DIAGNOSIS — Z7901 Long term (current) use of anticoagulants: Secondary | ICD-10-CM | POA: Diagnosis not present

## 2017-12-05 DIAGNOSIS — Z87891 Personal history of nicotine dependence: Secondary | ICD-10-CM | POA: Diagnosis not present

## 2017-12-05 DIAGNOSIS — Z86010 Personal history of colonic polyps: Secondary | ICD-10-CM | POA: Diagnosis not present

## 2017-12-05 DIAGNOSIS — M1990 Unspecified osteoarthritis, unspecified site: Secondary | ICD-10-CM | POA: Diagnosis not present

## 2017-12-05 DIAGNOSIS — Z7901 Long term (current) use of anticoagulants: Secondary | ICD-10-CM | POA: Diagnosis not present

## 2017-12-05 DIAGNOSIS — I10 Essential (primary) hypertension: Secondary | ICD-10-CM | POA: Diagnosis not present

## 2017-12-05 DIAGNOSIS — M109 Gout, unspecified: Secondary | ICD-10-CM | POA: Diagnosis not present

## 2017-12-05 DIAGNOSIS — I4891 Unspecified atrial fibrillation: Secondary | ICD-10-CM | POA: Diagnosis not present

## 2017-12-05 DIAGNOSIS — Z9049 Acquired absence of other specified parts of digestive tract: Secondary | ICD-10-CM | POA: Diagnosis not present

## 2017-12-05 DIAGNOSIS — G5601 Carpal tunnel syndrome, right upper limb: Secondary | ICD-10-CM | POA: Diagnosis not present

## 2017-12-14 DIAGNOSIS — H02109 Unspecified ectropion of unspecified eye, unspecified eyelid: Secondary | ICD-10-CM | POA: Diagnosis not present

## 2017-12-14 DIAGNOSIS — H02403 Unspecified ptosis of bilateral eyelids: Secondary | ICD-10-CM | POA: Diagnosis not present

## 2017-12-14 DIAGNOSIS — H40052 Ocular hypertension, left eye: Secondary | ICD-10-CM | POA: Diagnosis not present

## 2017-12-14 DIAGNOSIS — Z961 Presence of intraocular lens: Secondary | ICD-10-CM | POA: Diagnosis not present

## 2017-12-18 DIAGNOSIS — I482 Chronic atrial fibrillation: Secondary | ICD-10-CM | POA: Diagnosis not present

## 2018-01-17 DIAGNOSIS — H40013 Open angle with borderline findings, low risk, bilateral: Secondary | ICD-10-CM | POA: Diagnosis not present

## 2018-01-17 DIAGNOSIS — H40053 Ocular hypertension, bilateral: Secondary | ICD-10-CM | POA: Diagnosis not present

## 2018-01-22 DIAGNOSIS — J449 Chronic obstructive pulmonary disease, unspecified: Secondary | ICD-10-CM | POA: Diagnosis not present

## 2018-01-22 DIAGNOSIS — I482 Chronic atrial fibrillation, unspecified: Secondary | ICD-10-CM | POA: Diagnosis not present

## 2018-01-26 DIAGNOSIS — E782 Mixed hyperlipidemia: Secondary | ICD-10-CM | POA: Diagnosis not present

## 2018-01-26 DIAGNOSIS — Z0001 Encounter for general adult medical examination with abnormal findings: Secondary | ICD-10-CM | POA: Diagnosis not present

## 2018-01-26 DIAGNOSIS — E039 Hypothyroidism, unspecified: Secondary | ICD-10-CM | POA: Diagnosis not present

## 2018-01-26 DIAGNOSIS — I10 Essential (primary) hypertension: Secondary | ICD-10-CM | POA: Diagnosis not present

## 2018-01-30 DIAGNOSIS — Z23 Encounter for immunization: Secondary | ICD-10-CM | POA: Diagnosis not present

## 2018-01-30 DIAGNOSIS — Z0001 Encounter for general adult medical examination with abnormal findings: Secondary | ICD-10-CM | POA: Diagnosis not present

## 2018-01-30 DIAGNOSIS — I10 Essential (primary) hypertension: Secondary | ICD-10-CM | POA: Diagnosis not present

## 2018-02-26 DIAGNOSIS — I482 Chronic atrial fibrillation, unspecified: Secondary | ICD-10-CM | POA: Diagnosis not present

## 2018-04-09 DIAGNOSIS — I352 Nonrheumatic aortic (valve) stenosis with insufficiency: Secondary | ICD-10-CM | POA: Diagnosis not present

## 2018-04-09 DIAGNOSIS — S20219A Contusion of unspecified front wall of thorax, initial encounter: Secondary | ICD-10-CM | POA: Diagnosis not present

## 2018-04-09 DIAGNOSIS — I4891 Unspecified atrial fibrillation: Secondary | ICD-10-CM | POA: Diagnosis not present

## 2018-05-08 DIAGNOSIS — I352 Nonrheumatic aortic (valve) stenosis with insufficiency: Secondary | ICD-10-CM | POA: Diagnosis not present

## 2018-05-25 DIAGNOSIS — I4891 Unspecified atrial fibrillation: Secondary | ICD-10-CM | POA: Diagnosis not present

## 2018-05-25 DIAGNOSIS — K210 Gastro-esophageal reflux disease with esophagitis: Secondary | ICD-10-CM | POA: Diagnosis not present

## 2018-05-25 DIAGNOSIS — I10 Essential (primary) hypertension: Secondary | ICD-10-CM | POA: Diagnosis not present

## 2018-05-25 DIAGNOSIS — R7301 Impaired fasting glucose: Secondary | ICD-10-CM | POA: Diagnosis not present

## 2018-05-25 DIAGNOSIS — I5032 Chronic diastolic (congestive) heart failure: Secondary | ICD-10-CM | POA: Diagnosis not present

## 2018-05-30 DIAGNOSIS — Z23 Encounter for immunization: Secondary | ICD-10-CM | POA: Diagnosis not present

## 2018-05-30 DIAGNOSIS — E782 Mixed hyperlipidemia: Secondary | ICD-10-CM | POA: Diagnosis not present

## 2018-05-30 DIAGNOSIS — I10 Essential (primary) hypertension: Secondary | ICD-10-CM | POA: Diagnosis not present

## 2018-05-30 DIAGNOSIS — I4891 Unspecified atrial fibrillation: Secondary | ICD-10-CM | POA: Diagnosis not present

## 2018-05-30 DIAGNOSIS — J449 Chronic obstructive pulmonary disease, unspecified: Secondary | ICD-10-CM | POA: Diagnosis not present

## 2018-06-12 DIAGNOSIS — I352 Nonrheumatic aortic (valve) stenosis with insufficiency: Secondary | ICD-10-CM | POA: Diagnosis not present

## 2018-07-04 DIAGNOSIS — J329 Chronic sinusitis, unspecified: Secondary | ICD-10-CM | POA: Diagnosis not present

## 2018-08-22 DIAGNOSIS — I4891 Unspecified atrial fibrillation: Secondary | ICD-10-CM | POA: Diagnosis not present

## 2018-09-19 DIAGNOSIS — K219 Gastro-esophageal reflux disease without esophagitis: Secondary | ICD-10-CM | POA: Diagnosis not present

## 2018-09-19 DIAGNOSIS — R7301 Impaired fasting glucose: Secondary | ICD-10-CM | POA: Diagnosis not present

## 2018-09-19 DIAGNOSIS — K210 Gastro-esophageal reflux disease with esophagitis: Secondary | ICD-10-CM | POA: Diagnosis not present

## 2018-09-19 DIAGNOSIS — J449 Chronic obstructive pulmonary disease, unspecified: Secondary | ICD-10-CM | POA: Diagnosis not present

## 2018-09-19 DIAGNOSIS — I10 Essential (primary) hypertension: Secondary | ICD-10-CM | POA: Diagnosis not present

## 2018-09-24 DIAGNOSIS — J449 Chronic obstructive pulmonary disease, unspecified: Secondary | ICD-10-CM | POA: Diagnosis not present

## 2018-09-24 DIAGNOSIS — I10 Essential (primary) hypertension: Secondary | ICD-10-CM | POA: Diagnosis not present

## 2018-09-24 DIAGNOSIS — I5032 Chronic diastolic (congestive) heart failure: Secondary | ICD-10-CM | POA: Diagnosis not present

## 2018-09-24 DIAGNOSIS — E782 Mixed hyperlipidemia: Secondary | ICD-10-CM | POA: Diagnosis not present

## 2018-10-02 DIAGNOSIS — E782 Mixed hyperlipidemia: Secondary | ICD-10-CM | POA: Diagnosis not present

## 2018-10-16 DIAGNOSIS — I4891 Unspecified atrial fibrillation: Secondary | ICD-10-CM | POA: Diagnosis not present

## 2018-11-02 DIAGNOSIS — E7800 Pure hypercholesterolemia, unspecified: Secondary | ICD-10-CM | POA: Diagnosis not present

## 2018-11-02 DIAGNOSIS — I10 Essential (primary) hypertension: Secondary | ICD-10-CM | POA: Diagnosis not present

## 2018-11-12 DIAGNOSIS — L720 Epidermal cyst: Secondary | ICD-10-CM | POA: Diagnosis not present

## 2018-11-12 DIAGNOSIS — L728 Other follicular cysts of the skin and subcutaneous tissue: Secondary | ICD-10-CM | POA: Diagnosis not present

## 2018-11-12 DIAGNOSIS — C44319 Basal cell carcinoma of skin of other parts of face: Secondary | ICD-10-CM | POA: Diagnosis not present

## 2018-12-03 DIAGNOSIS — C44319 Basal cell carcinoma of skin of other parts of face: Secondary | ICD-10-CM | POA: Diagnosis not present

## 2018-12-03 DIAGNOSIS — L570 Actinic keratosis: Secondary | ICD-10-CM | POA: Diagnosis not present

## 2018-12-03 DIAGNOSIS — D2239 Melanocytic nevi of other parts of face: Secondary | ICD-10-CM | POA: Diagnosis not present

## 2018-12-03 DIAGNOSIS — X32XXXD Exposure to sunlight, subsequent encounter: Secondary | ICD-10-CM | POA: Diagnosis not present

## 2018-12-03 DIAGNOSIS — L720 Epidermal cyst: Secondary | ICD-10-CM | POA: Diagnosis not present

## 2018-12-04 DIAGNOSIS — I5032 Chronic diastolic (congestive) heart failure: Secondary | ICD-10-CM | POA: Diagnosis not present

## 2019-01-02 DIAGNOSIS — Z23 Encounter for immunization: Secondary | ICD-10-CM | POA: Diagnosis not present

## 2019-01-02 DIAGNOSIS — R0602 Shortness of breath: Secondary | ICD-10-CM | POA: Diagnosis not present

## 2019-01-02 DIAGNOSIS — I5032 Chronic diastolic (congestive) heart failure: Secondary | ICD-10-CM | POA: Diagnosis not present

## 2019-01-08 DIAGNOSIS — I352 Nonrheumatic aortic (valve) stenosis with insufficiency: Secondary | ICD-10-CM | POA: Diagnosis not present

## 2019-01-17 DIAGNOSIS — D485 Neoplasm of uncertain behavior of skin: Secondary | ICD-10-CM | POA: Diagnosis not present

## 2019-01-17 DIAGNOSIS — C44329 Squamous cell carcinoma of skin of other parts of face: Secondary | ICD-10-CM | POA: Diagnosis not present

## 2019-01-17 DIAGNOSIS — C44622 Squamous cell carcinoma of skin of right upper limb, including shoulder: Secondary | ICD-10-CM | POA: Diagnosis not present

## 2019-01-17 DIAGNOSIS — Z08 Encounter for follow-up examination after completed treatment for malignant neoplasm: Secondary | ICD-10-CM | POA: Diagnosis not present

## 2019-01-17 DIAGNOSIS — Z85828 Personal history of other malignant neoplasm of skin: Secondary | ICD-10-CM | POA: Diagnosis not present

## 2019-02-12 DIAGNOSIS — I352 Nonrheumatic aortic (valve) stenosis with insufficiency: Secondary | ICD-10-CM | POA: Diagnosis not present

## 2019-03-04 DIAGNOSIS — I482 Chronic atrial fibrillation, unspecified: Secondary | ICD-10-CM | POA: Diagnosis not present

## 2019-03-04 DIAGNOSIS — I10 Essential (primary) hypertension: Secondary | ICD-10-CM | POA: Diagnosis not present

## 2019-03-26 DIAGNOSIS — I352 Nonrheumatic aortic (valve) stenosis with insufficiency: Secondary | ICD-10-CM | POA: Diagnosis not present

## 2019-04-02 DIAGNOSIS — U071 COVID-19: Secondary | ICD-10-CM | POA: Diagnosis not present

## 2019-04-04 DIAGNOSIS — I4891 Unspecified atrial fibrillation: Secondary | ICD-10-CM | POA: Diagnosis not present

## 2019-04-17 DIAGNOSIS — J440 Chronic obstructive pulmonary disease with acute lower respiratory infection: Secondary | ICD-10-CM | POA: Diagnosis not present

## 2019-04-17 DIAGNOSIS — I482 Chronic atrial fibrillation, unspecified: Secondary | ICD-10-CM | POA: Diagnosis not present

## 2019-04-17 DIAGNOSIS — M1A9XX0 Chronic gout, unspecified, without tophus (tophi): Secondary | ICD-10-CM | POA: Diagnosis not present

## 2019-04-17 DIAGNOSIS — Z7901 Long term (current) use of anticoagulants: Secondary | ICD-10-CM | POA: Diagnosis not present

## 2019-04-17 DIAGNOSIS — R410 Disorientation, unspecified: Secondary | ICD-10-CM | POA: Diagnosis not present

## 2019-04-17 DIAGNOSIS — K047 Periapical abscess without sinus: Secondary | ICD-10-CM | POA: Diagnosis not present

## 2019-04-17 DIAGNOSIS — R404 Transient alteration of awareness: Secondary | ICD-10-CM | POA: Diagnosis not present

## 2019-04-17 DIAGNOSIS — R0602 Shortness of breath: Secondary | ICD-10-CM | POA: Diagnosis not present

## 2019-04-17 DIAGNOSIS — J9601 Acute respiratory failure with hypoxia: Secondary | ICD-10-CM | POA: Diagnosis not present

## 2019-04-17 DIAGNOSIS — R0902 Hypoxemia: Secondary | ICD-10-CM | POA: Diagnosis not present

## 2019-04-17 DIAGNOSIS — U071 COVID-19: Secondary | ICD-10-CM | POA: Diagnosis not present

## 2019-04-17 DIAGNOSIS — I110 Hypertensive heart disease with heart failure: Secondary | ICD-10-CM | POA: Diagnosis not present

## 2019-04-17 DIAGNOSIS — J0100 Acute maxillary sinusitis, unspecified: Secondary | ICD-10-CM | POA: Diagnosis not present

## 2019-04-17 DIAGNOSIS — G9341 Metabolic encephalopathy: Secondary | ICD-10-CM | POA: Diagnosis not present

## 2019-04-17 DIAGNOSIS — I5032 Chronic diastolic (congestive) heart failure: Secondary | ICD-10-CM | POA: Diagnosis not present

## 2019-04-17 DIAGNOSIS — R4020 Unspecified coma: Secondary | ICD-10-CM | POA: Diagnosis not present

## 2019-04-17 DIAGNOSIS — J301 Allergic rhinitis due to pollen: Secondary | ICD-10-CM | POA: Diagnosis not present

## 2019-04-17 DIAGNOSIS — Z743 Need for continuous supervision: Secondary | ICD-10-CM | POA: Diagnosis not present

## 2019-04-17 DIAGNOSIS — Z87891 Personal history of nicotine dependence: Secondary | ICD-10-CM | POA: Diagnosis not present

## 2019-04-18 DIAGNOSIS — U071 COVID-19: Secondary | ICD-10-CM | POA: Diagnosis not present

## 2019-04-18 DIAGNOSIS — G9341 Metabolic encephalopathy: Secondary | ICD-10-CM | POA: Diagnosis not present

## 2019-04-19 DIAGNOSIS — G9341 Metabolic encephalopathy: Secondary | ICD-10-CM | POA: Diagnosis not present

## 2019-04-19 DIAGNOSIS — U071 COVID-19: Secondary | ICD-10-CM | POA: Diagnosis not present

## 2019-04-20 DIAGNOSIS — G9341 Metabolic encephalopathy: Secondary | ICD-10-CM | POA: Diagnosis not present

## 2019-04-20 DIAGNOSIS — U071 COVID-19: Secondary | ICD-10-CM | POA: Diagnosis not present

## 2019-04-21 DIAGNOSIS — U071 COVID-19: Secondary | ICD-10-CM | POA: Diagnosis not present

## 2019-04-21 DIAGNOSIS — G9341 Metabolic encephalopathy: Secondary | ICD-10-CM | POA: Diagnosis not present

## 2019-04-29 DIAGNOSIS — L03312 Cellulitis of back [any part except buttock]: Secondary | ICD-10-CM | POA: Diagnosis not present

## 2019-05-03 DIAGNOSIS — I10 Essential (primary) hypertension: Secondary | ICD-10-CM | POA: Diagnosis not present

## 2019-05-03 DIAGNOSIS — E7849 Other hyperlipidemia: Secondary | ICD-10-CM | POA: Diagnosis not present

## 2019-05-13 DIAGNOSIS — R7301 Impaired fasting glucose: Secondary | ICD-10-CM | POA: Diagnosis not present

## 2019-05-13 DIAGNOSIS — J849 Interstitial pulmonary disease, unspecified: Secondary | ICD-10-CM | POA: Diagnosis not present

## 2019-05-13 DIAGNOSIS — E782 Mixed hyperlipidemia: Secondary | ICD-10-CM | POA: Diagnosis not present

## 2019-05-13 DIAGNOSIS — I352 Nonrheumatic aortic (valve) stenosis with insufficiency: Secondary | ICD-10-CM | POA: Diagnosis not present

## 2019-05-24 DIAGNOSIS — Z0001 Encounter for general adult medical examination with abnormal findings: Secondary | ICD-10-CM | POA: Diagnosis not present

## 2019-05-24 DIAGNOSIS — I5032 Chronic diastolic (congestive) heart failure: Secondary | ICD-10-CM | POA: Diagnosis not present

## 2019-05-24 DIAGNOSIS — I10 Essential (primary) hypertension: Secondary | ICD-10-CM | POA: Diagnosis not present

## 2019-05-24 DIAGNOSIS — Z23 Encounter for immunization: Secondary | ICD-10-CM | POA: Diagnosis not present

## 2019-05-24 DIAGNOSIS — J449 Chronic obstructive pulmonary disease, unspecified: Secondary | ICD-10-CM | POA: Diagnosis not present

## 2019-05-24 DIAGNOSIS — I352 Nonrheumatic aortic (valve) stenosis with insufficiency: Secondary | ICD-10-CM | POA: Diagnosis not present

## 2019-05-31 DIAGNOSIS — I482 Chronic atrial fibrillation, unspecified: Secondary | ICD-10-CM | POA: Diagnosis not present

## 2019-05-31 DIAGNOSIS — I110 Hypertensive heart disease with heart failure: Secondary | ICD-10-CM | POA: Diagnosis not present

## 2019-05-31 DIAGNOSIS — I5032 Chronic diastolic (congestive) heart failure: Secondary | ICD-10-CM | POA: Diagnosis not present

## 2019-06-08 ENCOUNTER — Ambulatory Visit: Payer: Medicare Other | Attending: Internal Medicine

## 2019-06-08 DIAGNOSIS — Z23 Encounter for immunization: Secondary | ICD-10-CM | POA: Insufficient documentation

## 2019-06-08 NOTE — Progress Notes (Signed)
   Covid-19 Vaccination Clinic  Name:  MAURISIO VORHIES    MRN: [id number] DOB: [birth year]  06/08/2019  Mr. Mcgibbon was observed post Covid-19 immunization for 15 minutes without incident. He was provided with Vaccine Information Sheet and instruction to access the V-Safe system.   Mr. Seagrave was instructed to call 911 with any severe reactions post vaccine: Marland Kitchen Difficulty breathing  . Swelling of face and throat  . A fast heartbeat  . A bad rash all over body  . Dizziness and weakness   Immunizations Administered    Name Date Dose VIS Date Route   Moderna COVID-19 Vaccine 06/08/2019  8:34 AM 0.5 mL 03/05/2019 Intramuscular   Manufacturer: Moderna   Lot: [id number]   [id number]

## 2019-06-17 DIAGNOSIS — I482 Chronic atrial fibrillation, unspecified: Secondary | ICD-10-CM | POA: Diagnosis not present

## 2019-07-03 DIAGNOSIS — K219 Gastro-esophageal reflux disease without esophagitis: Secondary | ICD-10-CM | POA: Diagnosis not present

## 2019-07-03 DIAGNOSIS — I10 Essential (primary) hypertension: Secondary | ICD-10-CM | POA: Diagnosis not present

## 2019-07-10 ENCOUNTER — Ambulatory Visit: Payer: Medicare Other | Attending: Internal Medicine

## 2019-07-10 DIAGNOSIS — Z23 Encounter for immunization: Secondary | ICD-10-CM

## 2019-07-10 NOTE — Progress Notes (Signed)
   Covid-19 Vaccination Clinic  Name:  Raymond Nunez    MRN: [id number] DOB: 24-Oct-1929  07/10/2019  Mr. Raymond Nunez was observed post Covid-19 immunization for 15 minutes without incident. He was provided with Vaccine Information Sheet and instruction to access the V-Safe system.   Mr. Raymond Nunez was instructed to call 911 with any severe reactions post vaccine: Marland Kitchen Difficulty breathing  . Swelling of face and throat  . A fast heartbeat  . A bad rash all over body  . Dizziness and weakness   Immunizations Administered    Name Date Dose VIS Date Route   Moderna COVID-19 Vaccine 07/10/2019  1:47 PM 0.5 mL 03/05/2019 Intramuscular   Manufacturer: Levan Hurst   LotUD:[id number]   [place]BE:[id number]

## 2019-07-11 ENCOUNTER — Ambulatory Visit: Payer: Medicare Other

## 2019-07-22 DIAGNOSIS — I482 Chronic atrial fibrillation, unspecified: Secondary | ICD-10-CM | POA: Diagnosis not present

## 2019-08-02 DIAGNOSIS — I10 Essential (primary) hypertension: Secondary | ICD-10-CM | POA: Diagnosis not present

## 2019-08-26 DIAGNOSIS — I5032 Chronic diastolic (congestive) heart failure: Secondary | ICD-10-CM | POA: Diagnosis not present

## 2019-08-26 DIAGNOSIS — I352 Nonrheumatic aortic (valve) stenosis with insufficiency: Secondary | ICD-10-CM | POA: Diagnosis not present

## 2019-08-26 DIAGNOSIS — J449 Chronic obstructive pulmonary disease, unspecified: Secondary | ICD-10-CM | POA: Diagnosis not present

## 2019-08-26 DIAGNOSIS — I10 Essential (primary) hypertension: Secondary | ICD-10-CM | POA: Diagnosis not present

## 2019-09-02 DIAGNOSIS — I110 Hypertensive heart disease with heart failure: Secondary | ICD-10-CM | POA: Diagnosis not present

## 2019-09-02 DIAGNOSIS — I5032 Chronic diastolic (congestive) heart failure: Secondary | ICD-10-CM | POA: Diagnosis not present

## 2019-09-02 DIAGNOSIS — I482 Chronic atrial fibrillation, unspecified: Secondary | ICD-10-CM | POA: Diagnosis not present

## 2019-09-02 DIAGNOSIS — Z87891 Personal history of nicotine dependence: Secondary | ICD-10-CM | POA: Diagnosis not present

## 2019-10-02 DIAGNOSIS — I482 Chronic atrial fibrillation, unspecified: Secondary | ICD-10-CM | POA: Diagnosis not present

## 2019-10-02 DIAGNOSIS — I5032 Chronic diastolic (congestive) heart failure: Secondary | ICD-10-CM | POA: Diagnosis not present

## 2019-10-02 DIAGNOSIS — I110 Hypertensive heart disease with heart failure: Secondary | ICD-10-CM | POA: Diagnosis not present

## 2019-10-02 DIAGNOSIS — Z87891 Personal history of nicotine dependence: Secondary | ICD-10-CM | POA: Diagnosis not present

## 2019-10-07 DIAGNOSIS — R739 Hyperglycemia, unspecified: Secondary | ICD-10-CM | POA: Diagnosis not present

## 2019-10-07 DIAGNOSIS — I4891 Unspecified atrial fibrillation: Secondary | ICD-10-CM | POA: Diagnosis not present

## 2019-10-07 DIAGNOSIS — E755 Other lipid storage disorders: Secondary | ICD-10-CM | POA: Diagnosis not present

## 2019-10-15 DIAGNOSIS — E782 Mixed hyperlipidemia: Secondary | ICD-10-CM | POA: Diagnosis not present

## 2019-10-15 DIAGNOSIS — I10 Essential (primary) hypertension: Secondary | ICD-10-CM | POA: Diagnosis not present

## 2019-10-15 DIAGNOSIS — Z0001 Encounter for general adult medical examination with abnormal findings: Secondary | ICD-10-CM | POA: Diagnosis not present

## 2019-10-15 DIAGNOSIS — I352 Nonrheumatic aortic (valve) stenosis with insufficiency: Secondary | ICD-10-CM | POA: Diagnosis not present

## 2019-10-31 DIAGNOSIS — L820 Inflamed seborrheic keratosis: Secondary | ICD-10-CM | POA: Diagnosis not present

## 2019-10-31 DIAGNOSIS — Z85828 Personal history of other malignant neoplasm of skin: Secondary | ICD-10-CM | POA: Diagnosis not present

## 2019-10-31 DIAGNOSIS — Z08 Encounter for follow-up examination after completed treatment for malignant neoplasm: Secondary | ICD-10-CM | POA: Diagnosis not present

## 2019-10-31 DIAGNOSIS — I872 Venous insufficiency (chronic) (peripheral): Secondary | ICD-10-CM | POA: Diagnosis not present

## 2019-11-01 DIAGNOSIS — I5032 Chronic diastolic (congestive) heart failure: Secondary | ICD-10-CM | POA: Diagnosis not present

## 2019-11-01 DIAGNOSIS — I110 Hypertensive heart disease with heart failure: Secondary | ICD-10-CM | POA: Diagnosis not present

## 2019-11-01 DIAGNOSIS — I482 Chronic atrial fibrillation, unspecified: Secondary | ICD-10-CM | POA: Diagnosis not present

## 2019-11-01 DIAGNOSIS — Z87891 Personal history of nicotine dependence: Secondary | ICD-10-CM | POA: Diagnosis not present

## 2019-11-18 DIAGNOSIS — I4891 Unspecified atrial fibrillation: Secondary | ICD-10-CM | POA: Diagnosis not present

## 2019-12-03 DIAGNOSIS — I5032 Chronic diastolic (congestive) heart failure: Secondary | ICD-10-CM | POA: Diagnosis not present

## 2019-12-03 DIAGNOSIS — I482 Chronic atrial fibrillation, unspecified: Secondary | ICD-10-CM | POA: Diagnosis not present

## 2019-12-03 DIAGNOSIS — Z87891 Personal history of nicotine dependence: Secondary | ICD-10-CM | POA: Diagnosis not present

## 2019-12-03 DIAGNOSIS — I110 Hypertensive heart disease with heart failure: Secondary | ICD-10-CM | POA: Diagnosis not present

## 2019-12-30 DIAGNOSIS — I4891 Unspecified atrial fibrillation: Secondary | ICD-10-CM | POA: Diagnosis not present

## 2020-01-02 DIAGNOSIS — I482 Chronic atrial fibrillation, unspecified: Secondary | ICD-10-CM | POA: Diagnosis not present

## 2020-01-02 DIAGNOSIS — I5032 Chronic diastolic (congestive) heart failure: Secondary | ICD-10-CM | POA: Diagnosis not present

## 2020-01-02 DIAGNOSIS — I110 Hypertensive heart disease with heart failure: Secondary | ICD-10-CM | POA: Diagnosis not present

## 2020-01-02 DIAGNOSIS — Z87891 Personal history of nicotine dependence: Secondary | ICD-10-CM | POA: Diagnosis not present

## 2020-01-22 DIAGNOSIS — I4891 Unspecified atrial fibrillation: Secondary | ICD-10-CM | POA: Diagnosis not present

## 2020-01-22 DIAGNOSIS — I5031 Acute diastolic (congestive) heart failure: Secondary | ICD-10-CM | POA: Diagnosis not present

## 2020-01-22 DIAGNOSIS — J449 Chronic obstructive pulmonary disease, unspecified: Secondary | ICD-10-CM | POA: Diagnosis not present

## 2020-01-22 DIAGNOSIS — Z23 Encounter for immunization: Secondary | ICD-10-CM | POA: Diagnosis not present

## 2020-02-01 DIAGNOSIS — I110 Hypertensive heart disease with heart failure: Secondary | ICD-10-CM | POA: Diagnosis not present

## 2020-02-01 DIAGNOSIS — Z87891 Personal history of nicotine dependence: Secondary | ICD-10-CM | POA: Diagnosis not present

## 2020-02-01 DIAGNOSIS — I482 Chronic atrial fibrillation, unspecified: Secondary | ICD-10-CM | POA: Diagnosis not present

## 2020-02-01 DIAGNOSIS — I5032 Chronic diastolic (congestive) heart failure: Secondary | ICD-10-CM | POA: Diagnosis not present

## 2020-02-03 DIAGNOSIS — I4891 Unspecified atrial fibrillation: Secondary | ICD-10-CM | POA: Diagnosis not present

## 2020-02-05 DIAGNOSIS — J449 Chronic obstructive pulmonary disease, unspecified: Secondary | ICD-10-CM | POA: Diagnosis not present

## 2020-02-05 DIAGNOSIS — I5031 Acute diastolic (congestive) heart failure: Secondary | ICD-10-CM | POA: Diagnosis not present

## 2020-02-05 DIAGNOSIS — I4891 Unspecified atrial fibrillation: Secondary | ICD-10-CM | POA: Diagnosis not present

## 2020-02-26 DIAGNOSIS — Z23 Encounter for immunization: Secondary | ICD-10-CM | POA: Diagnosis not present

## 2020-03-03 DIAGNOSIS — Z87891 Personal history of nicotine dependence: Secondary | ICD-10-CM | POA: Diagnosis not present

## 2020-03-03 DIAGNOSIS — I5032 Chronic diastolic (congestive) heart failure: Secondary | ICD-10-CM | POA: Diagnosis not present

## 2020-03-03 DIAGNOSIS — I482 Chronic atrial fibrillation, unspecified: Secondary | ICD-10-CM | POA: Diagnosis not present

## 2020-03-03 DIAGNOSIS — I110 Hypertensive heart disease with heart failure: Secondary | ICD-10-CM | POA: Diagnosis not present

## 2020-03-09 DIAGNOSIS — I4891 Unspecified atrial fibrillation: Secondary | ICD-10-CM | POA: Diagnosis not present

## 2020-04-24 DIAGNOSIS — I4891 Unspecified atrial fibrillation: Secondary | ICD-10-CM | POA: Diagnosis not present

## 2020-05-02 DIAGNOSIS — Z87891 Personal history of nicotine dependence: Secondary | ICD-10-CM | POA: Diagnosis not present

## 2020-05-02 DIAGNOSIS — I110 Hypertensive heart disease with heart failure: Secondary | ICD-10-CM | POA: Diagnosis not present

## 2020-05-02 DIAGNOSIS — I5032 Chronic diastolic (congestive) heart failure: Secondary | ICD-10-CM | POA: Diagnosis not present

## 2020-05-02 DIAGNOSIS — I482 Chronic atrial fibrillation, unspecified: Secondary | ICD-10-CM | POA: Diagnosis not present

## 2020-06-01 DIAGNOSIS — I482 Chronic atrial fibrillation, unspecified: Secondary | ICD-10-CM | POA: Diagnosis not present

## 2020-06-01 DIAGNOSIS — Z87891 Personal history of nicotine dependence: Secondary | ICD-10-CM | POA: Diagnosis not present

## 2020-06-01 DIAGNOSIS — I110 Hypertensive heart disease with heart failure: Secondary | ICD-10-CM | POA: Diagnosis not present

## 2020-06-01 DIAGNOSIS — I5032 Chronic diastolic (congestive) heart failure: Secondary | ICD-10-CM | POA: Diagnosis not present

## 2020-06-05 DIAGNOSIS — I4891 Unspecified atrial fibrillation: Secondary | ICD-10-CM | POA: Diagnosis not present

## 2020-07-01 DIAGNOSIS — Z87891 Personal history of nicotine dependence: Secondary | ICD-10-CM | POA: Diagnosis not present

## 2020-07-01 DIAGNOSIS — I5032 Chronic diastolic (congestive) heart failure: Secondary | ICD-10-CM | POA: Diagnosis not present

## 2020-07-01 DIAGNOSIS — I482 Chronic atrial fibrillation, unspecified: Secondary | ICD-10-CM | POA: Diagnosis not present

## 2020-07-01 DIAGNOSIS — I110 Hypertensive heart disease with heart failure: Secondary | ICD-10-CM | POA: Diagnosis not present

## 2020-07-09 DIAGNOSIS — I872 Venous insufficiency (chronic) (peripheral): Secondary | ICD-10-CM | POA: Diagnosis not present

## 2020-07-09 DIAGNOSIS — X32XXXD Exposure to sunlight, subsequent encounter: Secondary | ICD-10-CM | POA: Diagnosis not present

## 2020-07-09 DIAGNOSIS — C44319 Basal cell carcinoma of skin of other parts of face: Secondary | ICD-10-CM | POA: Diagnosis not present

## 2020-07-09 DIAGNOSIS — L570 Actinic keratosis: Secondary | ICD-10-CM | POA: Diagnosis not present

## 2020-07-21 DIAGNOSIS — R059 Cough, unspecified: Secondary | ICD-10-CM | POA: Diagnosis not present

## 2020-07-21 DIAGNOSIS — R509 Fever, unspecified: Secondary | ICD-10-CM | POA: Diagnosis not present

## 2020-07-21 DIAGNOSIS — I5032 Chronic diastolic (congestive) heart failure: Secondary | ICD-10-CM | POA: Diagnosis not present

## 2020-08-01 DIAGNOSIS — Z87891 Personal history of nicotine dependence: Secondary | ICD-10-CM | POA: Diagnosis not present

## 2020-08-01 DIAGNOSIS — I5032 Chronic diastolic (congestive) heart failure: Secondary | ICD-10-CM | POA: Diagnosis not present

## 2020-08-01 DIAGNOSIS — I482 Chronic atrial fibrillation, unspecified: Secondary | ICD-10-CM | POA: Diagnosis not present

## 2020-08-01 DIAGNOSIS — I110 Hypertensive heart disease with heart failure: Secondary | ICD-10-CM | POA: Diagnosis not present

## 2020-08-03 DIAGNOSIS — I5031 Acute diastolic (congestive) heart failure: Secondary | ICD-10-CM | POA: Diagnosis not present

## 2020-08-03 DIAGNOSIS — I4891 Unspecified atrial fibrillation: Secondary | ICD-10-CM | POA: Diagnosis not present

## 2020-08-03 DIAGNOSIS — R059 Cough, unspecified: Secondary | ICD-10-CM | POA: Diagnosis not present

## 2020-08-04 DIAGNOSIS — L03116 Cellulitis of left lower limb: Secondary | ICD-10-CM | POA: Diagnosis not present

## 2020-08-04 DIAGNOSIS — I5031 Acute diastolic (congestive) heart failure: Secondary | ICD-10-CM | POA: Diagnosis not present

## 2020-08-10 DIAGNOSIS — I4891 Unspecified atrial fibrillation: Secondary | ICD-10-CM | POA: Diagnosis not present

## 2020-08-10 DIAGNOSIS — I10 Essential (primary) hypertension: Secondary | ICD-10-CM | POA: Diagnosis not present

## 2020-08-10 DIAGNOSIS — R7301 Impaired fasting glucose: Secondary | ICD-10-CM | POA: Diagnosis not present

## 2020-08-11 DIAGNOSIS — E7849 Other hyperlipidemia: Secondary | ICD-10-CM | POA: Diagnosis not present

## 2020-08-11 DIAGNOSIS — J449 Chronic obstructive pulmonary disease, unspecified: Secondary | ICD-10-CM | POA: Diagnosis not present

## 2020-08-11 DIAGNOSIS — I5032 Chronic diastolic (congestive) heart failure: Secondary | ICD-10-CM | POA: Diagnosis not present

## 2020-08-11 DIAGNOSIS — I10 Essential (primary) hypertension: Secondary | ICD-10-CM | POA: Diagnosis not present

## 2020-08-11 DIAGNOSIS — Z23 Encounter for immunization: Secondary | ICD-10-CM | POA: Diagnosis not present

## 2020-08-12 DIAGNOSIS — U071 COVID-19: Secondary | ICD-10-CM | POA: Diagnosis not present

## 2020-08-12 DIAGNOSIS — I4891 Unspecified atrial fibrillation: Secondary | ICD-10-CM | POA: Diagnosis not present

## 2020-08-14 DIAGNOSIS — I4891 Unspecified atrial fibrillation: Secondary | ICD-10-CM | POA: Diagnosis not present

## 2020-08-20 DIAGNOSIS — I4891 Unspecified atrial fibrillation: Secondary | ICD-10-CM | POA: Diagnosis not present

## 2020-08-31 DIAGNOSIS — I482 Chronic atrial fibrillation, unspecified: Secondary | ICD-10-CM | POA: Diagnosis not present

## 2020-08-31 DIAGNOSIS — Z87891 Personal history of nicotine dependence: Secondary | ICD-10-CM | POA: Diagnosis not present

## 2020-08-31 DIAGNOSIS — I5032 Chronic diastolic (congestive) heart failure: Secondary | ICD-10-CM | POA: Diagnosis not present

## 2020-08-31 DIAGNOSIS — I110 Hypertensive heart disease with heart failure: Secondary | ICD-10-CM | POA: Diagnosis not present

## 2020-09-09 DIAGNOSIS — M6281 Muscle weakness (generalized): Secondary | ICD-10-CM | POA: Diagnosis not present

## 2020-09-09 DIAGNOSIS — R262 Difficulty in walking, not elsewhere classified: Secondary | ICD-10-CM | POA: Diagnosis not present

## 2020-09-11 DIAGNOSIS — R262 Difficulty in walking, not elsewhere classified: Secondary | ICD-10-CM | POA: Diagnosis not present

## 2020-09-11 DIAGNOSIS — M6281 Muscle weakness (generalized): Secondary | ICD-10-CM | POA: Diagnosis not present

## 2020-09-14 DIAGNOSIS — R262 Difficulty in walking, not elsewhere classified: Secondary | ICD-10-CM | POA: Diagnosis not present

## 2020-09-14 DIAGNOSIS — M6281 Muscle weakness (generalized): Secondary | ICD-10-CM | POA: Diagnosis not present

## 2020-09-18 DIAGNOSIS — R262 Difficulty in walking, not elsewhere classified: Secondary | ICD-10-CM | POA: Diagnosis not present

## 2020-09-18 DIAGNOSIS — M6281 Muscle weakness (generalized): Secondary | ICD-10-CM | POA: Diagnosis not present

## 2020-09-22 DIAGNOSIS — R262 Difficulty in walking, not elsewhere classified: Secondary | ICD-10-CM | POA: Diagnosis not present

## 2020-09-22 DIAGNOSIS — M6281 Muscle weakness (generalized): Secondary | ICD-10-CM | POA: Diagnosis not present

## 2020-09-24 DIAGNOSIS — I4891 Unspecified atrial fibrillation: Secondary | ICD-10-CM | POA: Diagnosis not present

## 2020-10-01 DIAGNOSIS — Z87891 Personal history of nicotine dependence: Secondary | ICD-10-CM | POA: Diagnosis not present

## 2020-10-01 DIAGNOSIS — M6281 Muscle weakness (generalized): Secondary | ICD-10-CM | POA: Diagnosis not present

## 2020-10-01 DIAGNOSIS — I110 Hypertensive heart disease with heart failure: Secondary | ICD-10-CM | POA: Diagnosis not present

## 2020-10-01 DIAGNOSIS — I482 Chronic atrial fibrillation, unspecified: Secondary | ICD-10-CM | POA: Diagnosis not present

## 2020-10-01 DIAGNOSIS — R262 Difficulty in walking, not elsewhere classified: Secondary | ICD-10-CM | POA: Diagnosis not present

## 2020-10-01 DIAGNOSIS — I5032 Chronic diastolic (congestive) heart failure: Secondary | ICD-10-CM | POA: Diagnosis not present

## 2020-10-08 DIAGNOSIS — R262 Difficulty in walking, not elsewhere classified: Secondary | ICD-10-CM | POA: Diagnosis not present

## 2020-10-08 DIAGNOSIS — M6281 Muscle weakness (generalized): Secondary | ICD-10-CM | POA: Diagnosis not present

## 2020-10-12 DIAGNOSIS — M6281 Muscle weakness (generalized): Secondary | ICD-10-CM | POA: Diagnosis not present

## 2020-10-12 DIAGNOSIS — R262 Difficulty in walking, not elsewhere classified: Secondary | ICD-10-CM | POA: Diagnosis not present

## 2020-10-15 DIAGNOSIS — M6281 Muscle weakness (generalized): Secondary | ICD-10-CM | POA: Diagnosis not present

## 2020-10-15 DIAGNOSIS — R262 Difficulty in walking, not elsewhere classified: Secondary | ICD-10-CM | POA: Diagnosis not present

## 2020-11-01 DIAGNOSIS — I10 Essential (primary) hypertension: Secondary | ICD-10-CM | POA: Diagnosis not present

## 2020-11-01 DIAGNOSIS — I5032 Chronic diastolic (congestive) heart failure: Secondary | ICD-10-CM | POA: Diagnosis not present

## 2020-11-01 DIAGNOSIS — I482 Chronic atrial fibrillation, unspecified: Secondary | ICD-10-CM | POA: Diagnosis not present

## 2020-11-01 DIAGNOSIS — Z87891 Personal history of nicotine dependence: Secondary | ICD-10-CM | POA: Diagnosis not present

## 2020-11-03 DIAGNOSIS — I10 Essential (primary) hypertension: Secondary | ICD-10-CM | POA: Diagnosis not present

## 2020-11-03 DIAGNOSIS — Z1329 Encounter for screening for other suspected endocrine disorder: Secondary | ICD-10-CM | POA: Diagnosis not present

## 2020-11-03 DIAGNOSIS — K2100 Gastro-esophageal reflux disease with esophagitis, without bleeding: Secondary | ICD-10-CM | POA: Diagnosis not present

## 2020-11-03 DIAGNOSIS — R7301 Impaired fasting glucose: Secondary | ICD-10-CM | POA: Diagnosis not present

## 2020-11-03 DIAGNOSIS — E7849 Other hyperlipidemia: Secondary | ICD-10-CM | POA: Diagnosis not present

## 2020-11-03 DIAGNOSIS — E782 Mixed hyperlipidemia: Secondary | ICD-10-CM | POA: Diagnosis not present

## 2020-11-06 DIAGNOSIS — I5032 Chronic diastolic (congestive) heart failure: Secondary | ICD-10-CM | POA: Diagnosis not present

## 2020-11-06 DIAGNOSIS — I10 Essential (primary) hypertension: Secondary | ICD-10-CM | POA: Diagnosis not present

## 2020-11-06 DIAGNOSIS — J449 Chronic obstructive pulmonary disease, unspecified: Secondary | ICD-10-CM | POA: Diagnosis not present

## 2020-11-06 DIAGNOSIS — R7301 Impaired fasting glucose: Secondary | ICD-10-CM | POA: Diagnosis not present

## 2020-11-06 DIAGNOSIS — I482 Chronic atrial fibrillation, unspecified: Secondary | ICD-10-CM | POA: Diagnosis not present

## 2020-11-06 DIAGNOSIS — Z0001 Encounter for general adult medical examination with abnormal findings: Secondary | ICD-10-CM | POA: Diagnosis not present

## 2020-11-06 DIAGNOSIS — E7849 Other hyperlipidemia: Secondary | ICD-10-CM | POA: Diagnosis not present

## 2020-12-21 DIAGNOSIS — I4891 Unspecified atrial fibrillation: Secondary | ICD-10-CM | POA: Diagnosis not present

## 2021-01-25 DIAGNOSIS — I4891 Unspecified atrial fibrillation: Secondary | ICD-10-CM | POA: Diagnosis not present

## 2021-03-03 DIAGNOSIS — I482 Chronic atrial fibrillation, unspecified: Secondary | ICD-10-CM | POA: Diagnosis not present

## 2021-03-03 DIAGNOSIS — Z87891 Personal history of nicotine dependence: Secondary | ICD-10-CM | POA: Diagnosis not present

## 2021-03-03 DIAGNOSIS — I5032 Chronic diastolic (congestive) heart failure: Secondary | ICD-10-CM | POA: Diagnosis not present

## 2021-03-03 DIAGNOSIS — I110 Hypertensive heart disease with heart failure: Secondary | ICD-10-CM | POA: Diagnosis not present

## 2021-03-08 DIAGNOSIS — I352 Nonrheumatic aortic (valve) stenosis with insufficiency: Secondary | ICD-10-CM | POA: Diagnosis not present

## 2021-03-08 DIAGNOSIS — I4891 Unspecified atrial fibrillation: Secondary | ICD-10-CM | POA: Diagnosis not present

## 2021-03-25 DIAGNOSIS — M5430 Sciatica, unspecified side: Secondary | ICD-10-CM | POA: Diagnosis not present

## 2021-04-02 DIAGNOSIS — I110 Hypertensive heart disease with heart failure: Secondary | ICD-10-CM | POA: Diagnosis not present

## 2021-04-02 DIAGNOSIS — I5022 Chronic systolic (congestive) heart failure: Secondary | ICD-10-CM | POA: Diagnosis not present

## 2021-04-02 DIAGNOSIS — I482 Chronic atrial fibrillation, unspecified: Secondary | ICD-10-CM | POA: Diagnosis not present

## 2021-04-02 DIAGNOSIS — Z87891 Personal history of nicotine dependence: Secondary | ICD-10-CM | POA: Diagnosis not present

## 2021-04-06 DIAGNOSIS — S32040A Wedge compression fracture of fourth lumbar vertebra, initial encounter for closed fracture: Secondary | ICD-10-CM | POA: Diagnosis not present

## 2021-04-06 DIAGNOSIS — M5116 Intervertebral disc disorders with radiculopathy, lumbar region: Secondary | ICD-10-CM | POA: Diagnosis not present

## 2021-04-07 ENCOUNTER — Other Ambulatory Visit (HOSPITAL_COMMUNITY): Payer: Self-pay | Admitting: Family Medicine

## 2021-04-07 DIAGNOSIS — M810 Age-related osteoporosis without current pathological fracture: Secondary | ICD-10-CM

## 2021-04-13 ENCOUNTER — Other Ambulatory Visit (HOSPITAL_COMMUNITY): Payer: Medicare Other

## 2021-04-15 ENCOUNTER — Other Ambulatory Visit: Payer: Self-pay

## 2021-04-15 ENCOUNTER — Ambulatory Visit (HOSPITAL_COMMUNITY)
Admission: RE | Admit: 2021-04-15 | Discharge: 2021-04-15 | Disposition: A | Discharge status: Home / Self Care | Payer: Medicare Other | Source: Ambulatory Visit | Attending: Family Medicine | Admitting: Family Medicine

## 2021-04-15 DIAGNOSIS — M810 Age-related osteoporosis without current pathological fracture: Secondary | ICD-10-CM | POA: Insufficient documentation

## 2021-04-15 DIAGNOSIS — M85851 Other specified disorders of bone density and structure, right thigh: Secondary | ICD-10-CM | POA: Diagnosis not present

## 2021-04-20 DIAGNOSIS — S32040A Wedge compression fracture of fourth lumbar vertebra, initial encounter for closed fracture: Secondary | ICD-10-CM | POA: Diagnosis not present

## 2021-04-21 ENCOUNTER — Other Ambulatory Visit: Payer: Self-pay | Admitting: Neurosurgery

## 2021-04-27 ENCOUNTER — Encounter (HOSPITAL_COMMUNITY): Payer: Self-pay | Admitting: Certified Registered"

## 2021-04-27 ENCOUNTER — Other Ambulatory Visit: Payer: Self-pay

## 2021-04-27 ENCOUNTER — Encounter (HOSPITAL_COMMUNITY): Payer: Self-pay | Admitting: Neurosurgery

## 2021-04-27 NOTE — Anesthesia Preprocedure Evaluation (Deleted)
Anesthesia Evaluation  Patient identified by MRN, date of birth, ID band Patient awake    Reviewed: Allergy & Precautions, NPO status , Patient's Chart, lab work & pertinent test results  History of Anesthesia Complications Negative for: history of anesthetic complications  Airway Mallampati: II  TM Distance: >3 FB Neck ROM: Full    Dental no notable dental hx.    Pulmonary COPD,  COPD inhaler, former smoker,    Pulmonary exam normal        Cardiovascular hypertension, Pt. on home beta blockers and Pt. on medications  Rhythm:Irregular Rate:Normal + Systolic murmurs    Neuro/Psych PSYCHIATRIC DISORDERS Anxiety Depression negative neurological ROS     GI/Hepatic negative GI ROS, Neg liver ROS,   Endo/Other  negative endocrine ROS  Renal/GU negative Renal ROS     Musculoskeletal negative musculoskeletal ROS (+)   Abdominal   Peds  Hematology negative hematology ROS (+)   Anesthesia Other Findings   Reproductive/Obstetrics                            Anesthesia Physical Anesthesia Plan  ASA: 3  Anesthesia Plan: General   Post-op Pain Management: Tylenol PO (pre-op)   Induction:   PONV Risk Score and Plan: 2 and Ondansetron and Dexamethasone  Airway Management Planned: Oral ETT  Additional Equipment:   Intra-op Plan:   Post-operative Plan: Extubation in OR  Informed Consent: I have reviewed the patients History and Physical, chart, labs and discussed the procedure including the risks, benefits and alternatives for the proposed anesthesia with the patient or authorized representative who has indicated his/her understanding and acceptance.     Dental advisory given  Plan Discussed with: Anesthesiologist and CRNA  Anesthesia Plan Comments: (Case cancelled due to INR)       Anesthesia Quick Evaluation

## 2021-04-27 NOTE — Progress Notes (Signed)
Raymond Nunez asked me to talk with his daughter Raymond Nunez, Raymond Nunez was sitting close by to answer questions as needed. Raymond Nunez reported that Raymond Nunez has not complained of chest pain; patient does have shortness of breath at times, he has COPD. Raymond Nunez denies having any s/s of Covid in her household.  Patient denies any known exposure to Covid.   Raymond Nunez PCP is Raymond Nunez, he sent a medical clearance. Raymond Nunez orders Coumadin for patient.  Raymond Nunez thinks Raymond Nunez has a history of afib- I did not find it ion the notes from Raymond. [name]; the office is closed now, none to ask. Raymond Nunez does not have a cardiologist.Last dose of Coumadin was 04/24/21.  I asked Raymond Nunez to instructed Raymond Nunez to wash up very well in am with antibiotic soap, if it is available.  Dry off with a clean towel. Do not put lotion, powder, cologne or deodorant or makeup.No jewelry or piercings. Men may shave their face and neck. Woman should not shave. No nail polish, artificial or acrylic nails. Wear clean clothes, brush your teeth. Glasses, contact lens,dentures or partials may not be worn in the OR. If you need to wear them, please bring a case for glasses, do not wear contacts or bring a case, the hospital does not have contact cases, dentures or partials will have to be removed , make sure they are clean, we will provide a denture cup to put them in. You will need some one to drive you home and a responsible person over the age of 29 to stay with you for the first 24 hours after surgery.

## 2021-04-28 ENCOUNTER — Encounter (HOSPITAL_COMMUNITY): Payer: Self-pay | Admitting: Neurosurgery

## 2021-04-28 ENCOUNTER — Other Ambulatory Visit: Payer: Self-pay

## 2021-04-28 ENCOUNTER — Encounter (HOSPITAL_COMMUNITY)
Admission: RE | Disposition: A | Discharge status: Home / Self Care | Payer: Self-pay | Source: Home / Self Care | Attending: Neurosurgery

## 2021-04-28 ENCOUNTER — Ambulatory Visit (HOSPITAL_COMMUNITY): Payer: Medicare Other

## 2021-04-28 ENCOUNTER — Ambulatory Visit (HOSPITAL_COMMUNITY)
Admission: RE | Admit: 2021-04-28 | Discharge: 2021-04-28 | Disposition: A | Discharge status: Home / Self Care | Payer: Medicare Other | Source: Home / Self Care | Attending: Neurosurgery | Admitting: Neurosurgery

## 2021-04-28 DIAGNOSIS — E119 Type 2 diabetes mellitus without complications: Secondary | ICD-10-CM | POA: Insufficient documentation

## 2021-04-28 DIAGNOSIS — M47816 Spondylosis without myelopathy or radiculopathy, lumbar region: Secondary | ICD-10-CM | POA: Diagnosis not present

## 2021-04-28 DIAGNOSIS — M4856XA Collapsed vertebra, not elsewhere classified, lumbar region, initial encounter for fracture: Secondary | ICD-10-CM | POA: Diagnosis not present

## 2021-04-28 DIAGNOSIS — I10 Essential (primary) hypertension: Secondary | ICD-10-CM | POA: Diagnosis not present

## 2021-04-28 DIAGNOSIS — Z20822 Contact with and (suspected) exposure to covid-19: Secondary | ICD-10-CM | POA: Insufficient documentation

## 2021-04-28 DIAGNOSIS — M47817 Spondylosis without myelopathy or radiculopathy, lumbosacral region: Secondary | ICD-10-CM | POA: Diagnosis not present

## 2021-04-28 DIAGNOSIS — Z885 Allergy status to narcotic agent status: Secondary | ICD-10-CM | POA: Diagnosis not present

## 2021-04-28 DIAGNOSIS — E669 Obesity, unspecified: Secondary | ICD-10-CM | POA: Diagnosis present

## 2021-04-28 DIAGNOSIS — Z01818 Encounter for other preprocedural examination: Secondary | ICD-10-CM | POA: Insufficient documentation

## 2021-04-28 DIAGNOSIS — S32049A Unspecified fracture of fourth lumbar vertebra, initial encounter for closed fracture: Secondary | ICD-10-CM | POA: Diagnosis not present

## 2021-04-28 DIAGNOSIS — M8008XG Age-related osteoporosis with current pathological fracture, vertebra(e), subsequent encounter for fracture with delayed healing: Secondary | ICD-10-CM | POA: Diagnosis not present

## 2021-04-28 DIAGNOSIS — J984 Other disorders of lung: Secondary | ICD-10-CM | POA: Diagnosis not present

## 2021-04-28 DIAGNOSIS — Z7901 Long term (current) use of anticoagulants: Secondary | ICD-10-CM | POA: Insufficient documentation

## 2021-04-28 DIAGNOSIS — M1990 Unspecified osteoarthritis, unspecified site: Secondary | ICD-10-CM | POA: Diagnosis not present

## 2021-04-28 DIAGNOSIS — Z981 Arthrodesis status: Secondary | ICD-10-CM | POA: Diagnosis not present

## 2021-04-28 DIAGNOSIS — Z419 Encounter for procedure for purposes other than remedying health state, unspecified: Secondary | ICD-10-CM

## 2021-04-28 DIAGNOSIS — Z888 Allergy status to other drugs, medicaments and biological substances status: Secondary | ICD-10-CM | POA: Diagnosis not present

## 2021-04-28 DIAGNOSIS — Z538 Procedure and treatment not carried out for other reasons: Secondary | ICD-10-CM | POA: Insufficient documentation

## 2021-04-28 DIAGNOSIS — I4891 Unspecified atrial fibrillation: Secondary | ICD-10-CM | POA: Diagnosis not present

## 2021-04-28 HISTORY — DX: Unspecified osteoarthritis, unspecified site: M19.90

## 2021-04-28 HISTORY — DX: Chronic obstructive pulmonary disease, unspecified: J44.9

## 2021-04-28 HISTORY — DX: Anxiety disorder, unspecified: F41.9

## 2021-04-28 HISTORY — DX: Pneumonia, unspecified organism: J18.9

## 2021-04-28 LAB — SURGICAL PCR SCREEN
MRSA, PCR: NEGATIVE
Staphylococcus aureus: NEGATIVE

## 2021-04-28 LAB — CBC
HCT: 38.2 % — ABNORMAL LOW (ref 39.0–52.0)
Hemoglobin: 12.5 g/dL — ABNORMAL LOW (ref 13.0–17.0)
MCH: 30.9 pg (ref 26.0–34.0)
MCHC: 32.7 g/dL (ref 30.0–36.0)
MCV: 94.6 fL (ref 80.0–100.0)
Platelets: 281 10*3/uL (ref 150–400)
RBC: 4.04 MIL/uL — ABNORMAL LOW (ref 4.22–5.81)
RDW: 15.9 % — ABNORMAL HIGH (ref 11.5–15.5)
WBC: 8.9 10*3/uL (ref 4.0–10.5)
nRBC: 0 % (ref 0.0–0.2)

## 2021-04-28 LAB — BASIC METABOLIC PANEL
Anion gap: 11 (ref 5–15)
BUN: 16 mg/dL (ref 8–23)
CO2: 25 mmol/L (ref 22–32)
Calcium: 9 mg/dL (ref 8.9–10.3)
Chloride: 102 mmol/L (ref 98–111)
Creatinine, Ser: 1.12 mg/dL (ref 0.61–1.24)
GFR, Estimated: >60 mL/min (ref >60)
Glucose, Bld: 93 mg/dL (ref 70–99)
Potassium: 4 mmol/L (ref 3.5–5.1)
Sodium: 138 mmol/L (ref 135–145)

## 2021-04-28 LAB — PROTIME-INR
INR: 1.8 — ABNORMAL HIGH (ref 0.8–1.2)
Prothrombin Time: 20.6 seconds — ABNORMAL HIGH (ref 11.4–15.2)

## 2021-04-28 LAB — SARS CORONAVIRUS 2 BY RT PCR (HOSPITAL ORDER, PERFORMED IN ~~LOC~~ HOSPITAL LAB): SARS Coronavirus 2: NEGATIVE

## 2021-04-28 SURGERY — KYPHOPLASTY
Anesthesia: General

## 2021-04-28 MED ORDER — LACTATED RINGERS IV SOLN: INTRAVENOUS | Status: DC

## 2021-04-28 MED ORDER — PROPOFOL 10 MG/ML IV BOLUS
INTRAVENOUS | Status: AC
  Filled 2021-04-28: qty 20

## 2021-04-28 MED ORDER — CHLORHEXIDINE GLUCONATE 0.12 % MT SOLN
15.0000 mL | Freq: Once | OROMUCOSAL | Status: AC
  Administered 2021-04-28: 07:00:00 15 mL via OROMUCOSAL
  Filled 2021-04-28: qty 15

## 2021-04-28 MED ORDER — ORAL CARE MOUTH RINSE: 15.0000 mL | Freq: Once | OROMUCOSAL | Status: AC

## 2021-04-28 MED ORDER — FENTANYL CITRATE (PF) 250 MCG/5ML IJ SOLN
INTRAMUSCULAR | Status: AC
  Filled 2021-04-28: qty 5

## 2021-04-28 MED ORDER — LIDOCAINE-EPINEPHRINE 1 %-1:100000 IJ SOLN
INTRAMUSCULAR | Status: AC
  Filled 2021-04-28: qty 1

## 2021-04-28 MED ORDER — LIDOCAINE 2% (20 MG/ML) 5 ML SYRINGE
INTRAMUSCULAR | Status: AC
  Filled 2021-04-28: qty 5

## 2021-04-28 MED ORDER — ACETAMINOPHEN 500 MG PO TABS
1000.0000 mg | ORAL_TABLET | Freq: Once | ORAL | Status: AC
Start: 2021-04-28 — End: 2021-04-28
  Administered 2021-04-28: 07:00:00 1000 mg via ORAL
  Filled 2021-04-28: qty 2

## 2021-04-28 MED ORDER — ROCURONIUM BROMIDE 10 MG/ML (PF) SYRINGE
PREFILLED_SYRINGE | INTRAVENOUS | Status: AC
  Filled 2021-04-28: qty 10

## 2021-04-28 NOTE — Progress Notes (Signed)
Dr. Christella Noa and Dr. Tobias Alexander notified about INR 1.8 and PT 20.6

## 2021-04-28 NOTE — Progress Notes (Signed)
The surgery was cancelled per Dr. Christella Noa verbal order, due to abnormal INR. Patient and patient's daughter Helene Kelp) were instructed to continue to hold Coumadin, recheck INR tomorrow and call Dr. Christella Noa office with result. Patient and patient's daughter verbalized understanding.

## 2021-04-29 DIAGNOSIS — I4891 Unspecified atrial fibrillation: Secondary | ICD-10-CM | POA: Diagnosis not present

## 2021-04-30 ENCOUNTER — Ambulatory Visit (HOSPITAL_COMMUNITY): Payer: Medicare Other | Admitting: Anesthesiology

## 2021-04-30 ENCOUNTER — Other Ambulatory Visit: Payer: Self-pay

## 2021-04-30 ENCOUNTER — Encounter (HOSPITAL_COMMUNITY)
Admission: AD | Disposition: A | Discharge status: Home / Self Care | Payer: Self-pay | Source: Home / Self Care | Attending: Neurosurgery

## 2021-04-30 ENCOUNTER — Encounter (HOSPITAL_COMMUNITY): Payer: Self-pay | Admitting: Neurosurgery

## 2021-04-30 ENCOUNTER — Other Ambulatory Visit: Payer: Self-pay | Admitting: Neurosurgery

## 2021-04-30 ENCOUNTER — Inpatient Hospital Stay (HOSPITAL_COMMUNITY): Payer: Medicare Other

## 2021-04-30 ENCOUNTER — Inpatient Hospital Stay (HOSPITAL_COMMUNITY)
Admission: AD | Admit: 2021-04-30 | Discharge: 2021-04-30 | DRG: 517 | Disposition: A | Discharge status: Home / Self Care | Payer: Medicare Other | Attending: Neurosurgery | Admitting: Neurosurgery

## 2021-04-30 DIAGNOSIS — M4856XA Collapsed vertebra, not elsewhere classified, lumbar region, initial encounter for fracture: Secondary | ICD-10-CM | POA: Diagnosis not present

## 2021-04-30 DIAGNOSIS — Z20822 Contact with and (suspected) exposure to covid-19: Secondary | ICD-10-CM | POA: Diagnosis present

## 2021-04-30 DIAGNOSIS — I10 Essential (primary) hypertension: Secondary | ICD-10-CM | POA: Diagnosis present

## 2021-04-30 DIAGNOSIS — Z9889 Other specified postprocedural states: Secondary | ICD-10-CM

## 2021-04-30 DIAGNOSIS — Z419 Encounter for procedure for purposes other than remedying health state, unspecified: Secondary | ICD-10-CM

## 2021-04-30 DIAGNOSIS — M1990 Unspecified osteoarthritis, unspecified site: Secondary | ICD-10-CM | POA: Diagnosis present

## 2021-04-30 DIAGNOSIS — S32049A Unspecified fracture of fourth lumbar vertebra, initial encounter for closed fracture: Secondary | ICD-10-CM | POA: Diagnosis present

## 2021-04-30 DIAGNOSIS — M8008XG Age-related osteoporosis with current pathological fracture, vertebra(e), subsequent encounter for fracture with delayed healing: Secondary | ICD-10-CM | POA: Diagnosis not present

## 2021-04-30 DIAGNOSIS — E669 Obesity, unspecified: Secondary | ICD-10-CM | POA: Diagnosis present

## 2021-04-30 DIAGNOSIS — J984 Other disorders of lung: Secondary | ICD-10-CM | POA: Diagnosis present

## 2021-04-30 DIAGNOSIS — Z888 Allergy status to other drugs, medicaments and biological substances status: Secondary | ICD-10-CM

## 2021-04-30 DIAGNOSIS — I4891 Unspecified atrial fibrillation: Secondary | ICD-10-CM | POA: Diagnosis not present

## 2021-04-30 DIAGNOSIS — Z885 Allergy status to narcotic agent status: Secondary | ICD-10-CM | POA: Diagnosis not present

## 2021-04-30 HISTORY — PX: KYPHOPLASTY: SHX5884

## 2021-04-30 SURGERY — KYPHOPLASTY
Anesthesia: General | Site: Spine Lumbar

## 2021-04-30 MED ORDER — IOPAMIDOL (ISOVUE-300) INJECTION 61%
INTRAVENOUS | Status: DC | PRN
  Administered 2021-04-30: 100 mL

## 2021-04-30 MED ORDER — LIDOCAINE HCL (CARDIAC) PF 100 MG/5ML IV SOSY
PREFILLED_SYRINGE | INTRAVENOUS | Status: DC | PRN
Start: 2021-04-30 — End: 2021-04-30
  Administered 2021-04-30: 60 mg via INTRAVENOUS

## 2021-04-30 MED ORDER — ONDANSETRON HCL 4 MG/2ML IJ SOLN
INTRAMUSCULAR | Status: DC | PRN
  Administered 2021-04-30: 4 mg via INTRAVENOUS

## 2021-04-30 MED ORDER — PHENYLEPHRINE HCL-NACL 20-0.9 MG/250ML-% IV SOLN
INTRAVENOUS | Status: DC | PRN
Start: 2021-04-30 — End: 2021-04-30
  Administered 2021-04-30: 40 ug/min via INTRAVENOUS

## 2021-04-30 MED ORDER — PROPOFOL 10 MG/ML IV BOLUS
INTRAVENOUS | Status: AC
  Filled 2021-04-30: qty 20

## 2021-04-30 MED ORDER — ORAL CARE MOUTH RINSE: 15.0000 mL | Freq: Once | OROMUCOSAL | Status: AC

## 2021-04-30 MED ORDER — LACTATED RINGERS IV SOLN: INTRAVENOUS | Status: DC

## 2021-04-30 MED ORDER — ROCURONIUM BROMIDE 100 MG/10ML IV SOLN
INTRAVENOUS | Status: DC | PRN
  Administered 2021-04-30: 70 mg via INTRAVENOUS

## 2021-04-30 MED ORDER — ACETAMINOPHEN 500 MG PO TABS
1000.0000 mg | ORAL_TABLET | Freq: Once | ORAL | Status: AC
  Administered 2021-04-30: 1000 mg via ORAL
  Filled 2021-04-30: qty 2

## 2021-04-30 MED ORDER — SUGAMMADEX SODIUM 200 MG/2ML IV SOLN
INTRAVENOUS | Status: DC | PRN
  Administered 2021-04-30: 300 mg via INTRAVENOUS

## 2021-04-30 MED ORDER — FENTANYL CITRATE (PF) 100 MCG/2ML IJ SOLN
INTRAMUSCULAR | Status: DC | PRN
  Administered 2021-04-30: 25 ug via INTRAVENOUS
  Administered 2021-04-30: 50 ug via INTRAVENOUS

## 2021-04-30 MED ORDER — CHLORHEXIDINE GLUCONATE CLOTH 2 % EX PADS: 6.0000 | MEDICATED_PAD | Freq: Once | CUTANEOUS | Status: DC

## 2021-04-30 MED ORDER — FENTANYL CITRATE (PF) 250 MCG/5ML IJ SOLN
INTRAMUSCULAR | Status: AC
  Filled 2021-04-30: qty 5

## 2021-04-30 MED ORDER — LACTATED RINGERS IV SOLN: INTRAVENOUS | Status: DC | PRN

## 2021-04-30 MED ORDER — PHENYLEPHRINE HCL (PRESSORS) 10 MG/ML IV SOLN
INTRAVENOUS | Status: DC | PRN
  Administered 2021-04-30: 120 ug via INTRAVENOUS
  Administered 2021-04-30: 80 ug via INTRAVENOUS

## 2021-04-30 MED ORDER — 0.9 % SODIUM CHLORIDE (POUR BTL) OPTIME
TOPICAL | Status: DC | PRN
  Administered 2021-04-30: 1000 mL

## 2021-04-30 MED ORDER — DEXAMETHASONE SODIUM PHOSPHATE 10 MG/ML IJ SOLN
INTRAMUSCULAR | Status: DC | PRN
  Administered 2021-04-30: 10 mg via INTRAVENOUS

## 2021-04-30 MED ORDER — PROPOFOL 10 MG/ML IV BOLUS
INTRAVENOUS | Status: DC | PRN
  Administered 2021-04-30: 100 mg via INTRAVENOUS

## 2021-04-30 MED ORDER — CEFAZOLIN SODIUM-DEXTROSE 2-4 GM/100ML-% IV SOLN
2.0000 g | INTRAVENOUS | Status: AC
  Administered 2021-04-30: 2 g via INTRAVENOUS
  Filled 2021-04-30: qty 100

## 2021-04-30 MED ORDER — LIDOCAINE-EPINEPHRINE 1 %-1:100000 IJ SOLN
INTRAMUSCULAR | Status: AC
  Filled 2021-04-30: qty 1

## 2021-04-30 MED ORDER — CHLORHEXIDINE GLUCONATE 0.12 % MT SOLN
15.0000 mL | Freq: Once | OROMUCOSAL | Status: AC
  Administered 2021-04-30: 15 mL via OROMUCOSAL

## 2021-04-30 SURGICAL SUPPLY — 37 items
ADH SKN CLS APL DERMABOND .7 (GAUZE/BANDAGES/DRESSINGS) ×1
BAG COUNTER SPONGE SURGICOUNT (BAG) ×3 IMPLANT
BAG SPNG CNTER NS LX DISP (BAG) ×1
BLADE CLIPPER SURG (BLADE) IMPLANT
BLADE SURG 15 STRL LF DISP TIS (BLADE) ×2 IMPLANT
BLADE SURG 15 STRL SS (BLADE) ×2
CEMENT KYPHON C01A KIT/MIXER (Cement) ×1 IMPLANT
DERMABOND ADVANCED (GAUZE/BANDAGES/DRESSINGS) ×1
DERMABOND ADVANCED .7 DNX12 (GAUZE/BANDAGES/DRESSINGS) ×2 IMPLANT
DRAPE C-ARM 42X72 X-RAY (DRAPES) ×3 IMPLANT
DRAPE HALF SHEET 40X57 (DRAPES) ×3 IMPLANT
DRAPE LAPAROTOMY 100X72X124 (DRAPES) ×3 IMPLANT
DRAPE SURG 17X23 STRL (DRAPES) ×3 IMPLANT
DRAPE WARM FLUID 44X44 (DRAPES) ×3 IMPLANT
DURAPREP 26ML APPLICATOR (WOUND CARE) ×3 IMPLANT
GAUZE 4X4 16PLY ~~LOC~~+RFID DBL (SPONGE) ×3 IMPLANT
GLOVE EXAM NITRILE XL STR (GLOVE) IMPLANT
GLOVE SURG LTX SZ6.5 (GLOVE) ×3 IMPLANT
GOWN STRL REUS W/ TWL LRG LVL3 (GOWN DISPOSABLE) ×4 IMPLANT
GOWN STRL REUS W/ TWL XL LVL3 (GOWN DISPOSABLE) IMPLANT
GOWN STRL REUS W/TWL 2XL LVL3 (GOWN DISPOSABLE) IMPLANT
GOWN STRL REUS W/TWL LRG LVL3 (GOWN DISPOSABLE) ×4
GOWN STRL REUS W/TWL XL LVL3 (GOWN DISPOSABLE)
KIT BASIN OR (CUSTOM PROCEDURE TRAY) ×3 IMPLANT
KIT TURNOVER KIT B (KITS) ×3 IMPLANT
NDL HYPO 25X1 1.5 SAFETY (NEEDLE) ×2 IMPLANT
NEEDLE HYPO 25X1 1.5 SAFETY (NEEDLE) ×2 IMPLANT
NS IRRIG 1000ML POUR BTL (IV SOLUTION) ×3 IMPLANT
PACK SURGICAL SETUP 50X90 (CUSTOM PROCEDURE TRAY) ×3 IMPLANT
PAD ARMBOARD 7.5X6 YLW CONV (MISCELLANEOUS) ×9 IMPLANT
SPECIMEN JAR SMALL (MISCELLANEOUS) IMPLANT
STAPLER SKIN PROX WIDE 3.9 (STAPLE) ×3 IMPLANT
SUT VIC AB 3-0 SH 8-18 (SUTURE) ×3 IMPLANT
SYR CONTROL 10ML LL (SYRINGE) ×6 IMPLANT
TOWEL GREEN STERILE (TOWEL DISPOSABLE) ×3 IMPLANT
TOWEL GREEN STERILE FF (TOWEL DISPOSABLE) ×3 IMPLANT
TRAY KYPHOPAK 20/3 ONESTEP 1ST (MISCELLANEOUS) ×1 IMPLANT

## 2021-04-30 NOTE — Anesthesia Preprocedure Evaluation (Addendum)
Anesthesia Evaluation  Patient identified by MRN, date of birth, ID band Patient awake    Reviewed: Allergy & Precautions, H&P , NPO status , Patient's Chart, lab work & pertinent test results, reviewed documented beta blocker date and time   Airway Mallampati: II  TM Distance: >3 FB Neck ROM: Full    Dental  (+) Dental Advisory Given, Missing   Pulmonary pneumonia, COPD,  COPD inhaler, former smoker,    breath sounds clear to auscultation- rhonchi + decreased breath sounds      Cardiovascular hypertension, Pt. on medications and Pt. on home beta blockers + dysrhythmias Atrial Fibrillation  Rhythm:Irregular Rate:Normal     Neuro/Psych PSYCHIATRIC DISORDERS Anxiety Depression negative neurological ROS     GI/Hepatic negative GI ROS, Neg liver ROS,   Endo/Other  Morbid obesity  Renal/GU negative Renal ROS     Musculoskeletal  (+) Arthritis , Osteoarthritis,    Abdominal (+) + obese,   Peds  Hematology negative hematology ROS (+)   Anesthesia Other Findings   Reproductive/Obstetrics                          Anesthesia Physical Anesthesia Plan  ASA: 3  Anesthesia Plan: General   Post-op Pain Management: Tylenol PO (pre-op)   Induction: Intravenous  PONV Risk Score and Plan: 3 and Ondansetron, Dexamethasone and Treatment may vary due to age or medical condition  Airway Management Planned: Oral ETT  Additional Equipment: None  Intra-op Plan:   Post-operative Plan: Extubation in OR  Informed Consent: I have reviewed the patients History and Physical, chart, labs and discussed the procedure including the risks, benefits and alternatives for the proposed anesthesia with the patient or authorized representative who has indicated his/her understanding and acceptance.     Dental advisory given  Plan Discussed with: CRNA  Anesthesia Plan Comments:        Anesthesia Quick  Evaluation

## 2021-04-30 NOTE — Progress Notes (Signed)
Pacu Discharge Note  Patient instuctions were given to family. Wound care, diet, pain, follow up care and how and whom to contact with concerns were discussed. Family aware someone needs to remain with patient overnight and concerns after receiving anesthesia and what to avoid and safety. Answered all questions and concerns.   Discharge paperwork has clear contact informations for surgeon and 24 hour RN line for concerns.   Discussed what concerns to look for including infection and signs/symptoms to look for.   IV was removed prior to discharge. Patient was brought to car with belongings.   Dr Christella Noa called to discuss when to restart Coumadin. Will call daughter with this update when Dr Christella Noa calls back.   Pt exits my care.

## 2021-04-30 NOTE — Anesthesia Procedure Notes (Signed)
Procedure Name: Intubation Date/Time: 04/30/2021 8:26 PM Performed by: Alexandrina Fiorini T, CRNA Pre-anesthesia Checklist: Patient identified, Emergency Drugs available, Suction available and Patient being monitored Patient Re-evaluated:Patient Re-evaluated prior to induction Oxygen Delivery Method: Circle system utilized Preoxygenation: Pre-oxygenation with 100% oxygen Induction Type: IV induction Ventilation: Mask ventilation without difficulty Laryngoscope Size: Mac and 4 Grade View: Grade II Tube type: Oral Tube size: 7.5 mm Number of attempts: 1 Airway Equipment and Method: Stylet Placement Confirmation: ETT inserted through vocal cords under direct vision, positive ETCO2 and breath sounds checked- equal and bilateral Secured at: 22 cm Tube secured with: Tape Dental Injury: Teeth and Oropharynx as per pre-operative assessment

## 2021-04-30 NOTE — Transfer of Care (Signed)
Immediate Anesthesia Transfer of Care Note  Patient: Raymond Nunez.  Procedure(s) Performed: LUMBAR FOUR KYPHOPLASTY (Spine Lumbar)  Patient Location: PACU  Anesthesia Type:General  Level of Consciousness: drowsy and patient cooperative  Airway & Oxygen Therapy: Patient Spontanous Breathing and Patient connected to nasal cannula oxygen  Post-op Assessment: Report given to RN, Post -op Vital signs reviewed and stable and Patient moving all extremities  Post vital signs: Reviewed and stable  Last Vitals:  Vitals Value Taken Time  BP 152/108 04/30/21 2129  Temp    Pulse 113 04/30/21 2129  Resp 18 04/30/21 2129  SpO2 95 % 04/30/21 2129  Vitals shown include unvalidated device data.  Last Pain:  Vitals:   04/30/21 1500  TempSrc: Oral  PainSc: 10-Worst pain ever      Patients Stated Pain Goal: 3 (20/94/70 9628)  Complications: No notable events documented.

## 2021-04-30 NOTE — Discharge Instructions (Signed)
Care After These instructions give you information on caring for yourself after your procedure. Your doctor may also give you more specific instructions. Call your doctor if you have any problems or questions after your procedure. HOME CARE Take medicine as told by your doctor. Keep your wound covered for 24 hours or as told by your doctor. Ask your doctor when you can bathe or shower. Put ice on your wound if it helps your pain. Put ice in a plastic bag. Place a towel between your skin and the bag. Leave the ice on for 15 to 20 minutes, 3 to 4 times a day.  Return to normal activities as told by your doctor. Ask your doctor what stretches and exercises you can do. Do not bend or lift anything heavy as told by your doctor. GET HELP RIGHT AWAY IF:  You have bad back pain that comes on suddenly. You cannot control when you pee (urinate) or poop (bowel movement). You lose feeling (numbness) in your legs or feet, or they become weak. You have shooting pain down your legs. You have a fever. Your wound becomes red, puffy (swollen), or tender to the touch. You are bleeding or leaking fluid from the wound. You are sick to your stomach (nauseous) or throw up (vomit) for more than 24 hours after the procedure. Your back pain does not get better. MAKE SURE YOU: Understand these instructions. Will watch your condition. Will get help right away if you are not doing well or get worse. Document Released: 06/15/2009 Document Revised: 06/13/2011 Document Reviewed: 08/25/2010 [facility] Patient Information 2013 [place].

## 2021-04-30 NOTE — H&P (Signed)
BP (!) 106/47    Pulse (!) 53    Temp (!) 97.5 F (36.4 C) (Oral)    Resp 15    Ht 5\' 9"  (1.753 m)    Wt 106.6 kg    SpO2 96%    BMI 34.70 kg/m  Raymond Nunez comes in today for evaluation of pain that he has in his back.  This pain has been debilitating, not something that he has ever had before.  Raymond Nunez is 88 years of age, 5 feet 9 inches, weighs 241 pounds, BMI is 35.59.  Blood pressure 106/65, pulse 60, respiratory rate 20, temperature is 97.7.     MEDICATIONS :  He takes Allopurinol, Citalopram, Diltiazem, Lasix, Ipratropium, Albuterol Nebulizer, Losartan, Metoprolol, Tamsulosin, Trazodone, Trelegy, Tylenol, Vitamin C, Vitamin D.     ALLERGIES :  He has an allergy to Hydrocodone.     PAST SURGICAL HISTORY :  His previous surgeries include surgery on his heel and a right carpal tunnel release.     PAST MEDICAL HISTORY :  Significant for hypertension and lung disease.  He sustained a compression fracture of L4 in December of 2022, pain down both legs, right leg is worse than left.  He has lost some weight recently.  All since lower back pain started.     REVIEW OF SYSTEMS :  Positive for leg pain with walking at rest.  Leg weakness and back pain along with neck pain and problems with coordination in the lower extremities.     PHYSICAL EXAMINATION :  On examination, he is alert, oriented by 4. He is answering all questions appropriately.  Memory, language, attention span, and fund of knowledge are normal.  Speech is clear.  It is also fluent.  Hearing intact to voice.  He finds it very difficult to walk and to bear weight.  He is finding it very difficult to sleep.  He was given oral prednisone without improvement.     IMAGING :  He then underwent an MRI and that revealed compression fracture of L4, there being edema within the bone.  No real canal compromise.  He does have some stenosis there, but there is no retropulsion of the bone.     PHYSICAL EXAMINATION  :  Raymond Nunez on exam is alert, oriented by 4.  He answers all questions appropriately.  He is in obvious pain and in distress.  He has full movement in the lower extremities, but it does hurt from the move, but normal muscle tone, bulk, and he has normal coordination.  Pupils equal, round, react to light.  Full extraocular movements.  Full visual fields.     IMPRESSION :  Raymond Nunez presents today with an acute fracture of L4 causing a great deal of pain.  I do believe he is an excellent candidate for a kyphoplasty.  We will try to get him on the schedule for next week, January 25th.  Risks and benefits were explained.  The procedure by placing a needle through the pedicle into the bone and then using a balloon and cement was also explained.  He understands and wishes to proceed.

## 2021-04-30 NOTE — Op Note (Signed)
04/30/2021  9:27 PM  PATIENT:  Phylliss Blakes.  88 y.o. male  PRE-OPERATIVE DIAGNOSIS:  Compression fx of L4 Vertebra  POST-OPERATIVE DIAGNOSIS:  Compression fx of L4 Vertebra  PROCEDURE:  Procedure(s): LUMBAR FOUR KYPHOPLASTY  SURGEON:  Surgeon(s): Ashok Pall, MD  ANESTHESIA:   none  EBL:  Total I/O In: -  Out: 5 [Blood:5]  BLOOD ADMINISTERED:none  COUNT:per nursing  SPECIMEN:  No Specimen  DICTATION: Mr. Dhawan was taken to the operating room, intubated and placed under a general anesthetic without difficulty. He was positioned prone on the operating room table with all pressure points properly padded. Her back was prepped and draped in a sterile manner. With fluoroscopy I localized the L4 pedicles bilaterally. I injected lidocaine into the entry sites on both the left and right sides. I started by making a stab incision on the right side and entering the L4 pedicle with fluoroscopic guidance. Once good position was obtained, I drilled into the vertebral body. I then placed the kyphoplasty balloon into the L4 vertebra and inflated the balloon. I then inserted 6cc of methylmethacrylate into the vertebral body under fluoroscopic guidance. I achieved a very good fill of the cavity, and crossfill to the other sidestaying within the confines of the vertebral body. I removed the instrumentation from the vertebral body, and the final films looked good. I closed the stab incision with vicryl suture and used Dermabond for a sterile dressing. Marland Kitchen    PLAN OF CARE: discharge home  PATIENT DISPOSITION:  PACU - hemodynamically stable.   Delay start of Pharmacological VTE agent (>24hrs) due to surgical blood loss or risk of bleeding:  yes

## 2021-04-30 NOTE — Progress Notes (Signed)
Pt brought PT/INR results from PCP office, paper copies placed in chart.  04/30/2021 PT 17.3, INR 1.4.

## 2021-05-02 NOTE — Anesthesia Postprocedure Evaluation (Signed)
Anesthesia Post Note  Patient: Raymond Nunez.  Procedure(s) Performed: LUMBAR FOUR KYPHOPLASTY (Spine Lumbar)     Patient location during evaluation: PACU Anesthesia Type: General Level of consciousness: awake and alert Pain management: pain level controlled Vital Signs Assessment: post-procedure vital signs reviewed and stable Respiratory status: spontaneous breathing, nonlabored ventilation, respiratory function stable and patient connected to nasal cannula oxygen Cardiovascular status: blood pressure returned to baseline and stable Postop Assessment: no apparent nausea or vomiting Anesthetic complications: no   No notable events documented.  Last Vitals:  Vitals:   04/30/21 2201 04/30/21 2220  BP: 135/81   Pulse: 80 80  Resp: 16   Temp: (!) 36.3 C   SpO2: 96% 97%    Last Pain:  Vitals:   04/30/21 2201  TempSrc:   PainSc: 0-No pain                 Kendal Raffo S

## 2021-05-03 ENCOUNTER — Encounter (HOSPITAL_COMMUNITY): Payer: Self-pay | Admitting: Neurosurgery

## 2021-05-17 DIAGNOSIS — S32040A Wedge compression fracture of fourth lumbar vertebra, initial encounter for closed fracture: Secondary | ICD-10-CM | POA: Diagnosis not present

## 2021-05-19 DIAGNOSIS — J329 Chronic sinusitis, unspecified: Secondary | ICD-10-CM | POA: Diagnosis not present

## 2021-05-19 NOTE — Discharge Summary (Signed)
Physician Discharge Summary  Patient ID: Raymond Nunez. MRN: [id number] DOB/AGE: 86-Feb-1931 86 y.o.  Admit date: 04/30/2021 Discharge date: 05/19/2021  Admission Diagnoses:L4 compression fracture  Discharge Diagnoses: same Principal Problem:   Status post surgery   Discharged Condition: good  Hospital Course: Raymond Nunez was taken to the operating room and underwent an uncomplicated kyphoplasty of L4. Post op he was ambulating, voiding, and tolerating a regular diet.he was discharged home from the PACU doing well.   Treatments: surgery: L4 kyphoplasty  Discharge Exam: Blood pressure 135/81, pulse 80, temperature (!) 97.3 F (36.3 C), resp. rate 16, height 5\' 9"  (1.753 m), weight 106.6 kg, SpO2 97 %. General appearance: alert, cooperative, and mild distress  Disposition:  Compression fx of L4 Vertebra  Allergies as of 04/30/2021       Reactions   Ace Inhibitors Cough   Hydrocodone Hives        Medication List     ASK your doctor about these medications    acetaminophen 650 MG CR tablet Commonly known as: TYLENOL Take 650 mg by mouth every 4 (four) hours.   albuterol 108 (90 Base) MCG/ACT inhaler Commonly known as: VENTOLIN HFA Inhale 2 puffs into the lungs every 4 (four) hours as needed for shortness of breath (COPD).   allopurinol 300 MG tablet Commonly known as: ZYLOPRIM Take 300 mg by mouth daily.   CALCIUM 500 PO Take 500 mg by mouth at bedtime.   diltiazem 240 MG 24 hr capsule Commonly known as: CARDIZEM CD Take 240 mg by mouth in the morning.   furosemide 40 MG tablet Commonly known as: LASIX Take 40 mg by mouth daily as needed for fluid or edema.   HYDROcodone-acetaminophen 5-325 MG tablet Commonly known as: NORCO/VICODIN Take 1 tablet by mouth every 6 (six) hours as needed.   lidocaine 5 % Commonly known as: LIDODERM Place 1 patch onto the skin daily as needed (pain). Remove & Discard patch within 12 hours or as directed by MD   LIDOCAINE  EX Apply 1 spray topically daily as needed (pain).   losartan 100 MG tablet Commonly known as: COZAAR Take 100 mg by mouth daily.   metoprolol tartrate 50 MG tablet Commonly known as: LOPRESSOR Take 50 mg by mouth 2 (two) times daily.   polyethylene glycol 17 g packet Commonly known as: MIRALAX / GLYCOLAX Take 17 g by mouth in the morning.   sertraline 100 MG tablet Commonly known as: ZOLOFT Take 200 mg by mouth in the morning.   traMADol 50 MG tablet Commonly known as: ULTRAM Take 50 mg by mouth 4 (four) times daily as needed for moderate pain.   traZODone 50 MG tablet Commonly known as: DESYREL Take 50 mg by mouth at bedtime.   Trelegy Ellipta 100-62.5-25 MCG/ACT Aepb Generic drug: Fluticasone-Umeclidin-Vilant Take 1 puff by mouth daily.   vitamin B-12 1000 MCG tablet Commonly known as: CYANOCOBALAMIN Take 1,000 mcg by mouth in the morning.   vitamin C 1000 MG tablet Take 1,000 mg by mouth at bedtime.   Vitamin D3 125 MCG (5000 UT) Tabs Take 5,000 Units by mouth daily.   warfarin 2 MG tablet Commonly known as: Coumadin Take 1 tablet (2 mg total) by mouth daily.   zinc gluconate 50 MG tablet Take 50 mg by mouth at bedtime.        Follow-up Information     Ashok Pall, MD Follow up in 3 week(s).   Specialty: Neurosurgery Why: please call to make  an appointment Contact information: [place]. [address] [place] [address] [phone]                 Signed: Ashok Pall 05/19/2021, 8:20 PM

## 2021-05-20 DIAGNOSIS — M5442 Lumbago with sciatica, left side: Secondary | ICD-10-CM | POA: Diagnosis not present

## 2021-05-20 DIAGNOSIS — S32049A Unspecified fracture of fourth lumbar vertebra, initial encounter for closed fracture: Secondary | ICD-10-CM | POA: Diagnosis not present

## 2021-06-01 DIAGNOSIS — I110 Hypertensive heart disease with heart failure: Secondary | ICD-10-CM | POA: Diagnosis not present

## 2021-06-01 DIAGNOSIS — I5032 Chronic diastolic (congestive) heart failure: Secondary | ICD-10-CM | POA: Diagnosis not present

## 2021-06-07 DIAGNOSIS — I10 Essential (primary) hypertension: Secondary | ICD-10-CM | POA: Diagnosis not present

## 2021-06-07 DIAGNOSIS — M25529 Pain in unspecified elbow: Secondary | ICD-10-CM | POA: Diagnosis not present

## 2021-06-07 DIAGNOSIS — R791 Abnormal coagulation profile: Secondary | ICD-10-CM | POA: Diagnosis not present

## 2021-06-07 DIAGNOSIS — M25521 Pain in right elbow: Secondary | ICD-10-CM | POA: Diagnosis not present

## 2021-06-07 DIAGNOSIS — M1990 Unspecified osteoarthritis, unspecified site: Secondary | ICD-10-CM | POA: Diagnosis not present

## 2021-06-07 DIAGNOSIS — R790 Abnormal level of blood mineral: Secondary | ICD-10-CM | POA: Diagnosis not present

## 2021-06-07 DIAGNOSIS — Z743 Need for continuous supervision: Secondary | ICD-10-CM | POA: Diagnosis not present

## 2021-06-07 DIAGNOSIS — M25421 Effusion, right elbow: Secondary | ICD-10-CM | POA: Diagnosis not present

## 2021-06-07 DIAGNOSIS — R918 Other nonspecific abnormal finding of lung field: Secondary | ICD-10-CM | POA: Diagnosis not present

## 2021-06-07 DIAGNOSIS — Z87891 Personal history of nicotine dependence: Secondary | ICD-10-CM | POA: Diagnosis not present

## 2021-06-07 DIAGNOSIS — F32A Depression, unspecified: Secondary | ICD-10-CM | POA: Diagnosis not present

## 2021-06-07 DIAGNOSIS — I4891 Unspecified atrial fibrillation: Secondary | ICD-10-CM | POA: Diagnosis not present

## 2021-06-07 DIAGNOSIS — W19XXXA Unspecified fall, initial encounter: Secondary | ICD-10-CM | POA: Diagnosis not present

## 2021-06-07 DIAGNOSIS — R059 Cough, unspecified: Secondary | ICD-10-CM | POA: Diagnosis not present

## 2021-06-07 DIAGNOSIS — R6889 Other general symptoms and signs: Secondary | ICD-10-CM | POA: Diagnosis not present

## 2021-06-07 DIAGNOSIS — W182XXA Fall in (into) shower or empty bathtub, initial encounter: Secondary | ICD-10-CM | POA: Diagnosis not present

## 2021-06-07 DIAGNOSIS — S5001XA Contusion of right elbow, initial encounter: Secondary | ICD-10-CM | POA: Diagnosis not present

## 2021-06-07 DIAGNOSIS — M542 Cervicalgia: Secondary | ICD-10-CM | POA: Diagnosis not present

## 2021-06-07 DIAGNOSIS — Z79899 Other long term (current) drug therapy: Secondary | ICD-10-CM | POA: Diagnosis not present

## 2021-06-07 DIAGNOSIS — R519 Headache, unspecified: Secondary | ICD-10-CM | POA: Diagnosis not present

## 2021-06-07 DIAGNOSIS — R52 Pain, unspecified: Secondary | ICD-10-CM | POA: Diagnosis not present

## 2021-06-11 DIAGNOSIS — S51011A Laceration without foreign body of right elbow, initial encounter: Secondary | ICD-10-CM | POA: Diagnosis not present

## 2021-06-11 DIAGNOSIS — M19021 Primary osteoarthritis, right elbow: Secondary | ICD-10-CM | POA: Diagnosis not present

## 2021-06-17 DIAGNOSIS — M5416 Radiculopathy, lumbar region: Secondary | ICD-10-CM | POA: Diagnosis not present

## 2021-06-18 DIAGNOSIS — Z7901 Long term (current) use of anticoagulants: Secondary | ICD-10-CM | POA: Diagnosis not present

## 2021-06-18 DIAGNOSIS — I480 Paroxysmal atrial fibrillation: Secondary | ICD-10-CM | POA: Diagnosis not present

## 2021-06-18 DIAGNOSIS — J329 Chronic sinusitis, unspecified: Secondary | ICD-10-CM | POA: Diagnosis not present

## 2021-06-18 DIAGNOSIS — Z5181 Encounter for therapeutic drug level monitoring: Secondary | ICD-10-CM | POA: Diagnosis not present

## 2021-06-18 DIAGNOSIS — I4891 Unspecified atrial fibrillation: Secondary | ICD-10-CM | POA: Diagnosis not present

## 2021-06-18 DIAGNOSIS — Z87891 Personal history of nicotine dependence: Secondary | ICD-10-CM | POA: Diagnosis not present

## 2021-06-18 DIAGNOSIS — M4856XD Collapsed vertebra, not elsewhere classified, lumbar region, subsequent encounter for fracture with routine healing: Secondary | ICD-10-CM | POA: Diagnosis not present

## 2021-06-18 DIAGNOSIS — I352 Nonrheumatic aortic (valve) stenosis with insufficiency: Secondary | ICD-10-CM | POA: Diagnosis not present

## 2021-06-21 DIAGNOSIS — I352 Nonrheumatic aortic (valve) stenosis with insufficiency: Secondary | ICD-10-CM | POA: Diagnosis not present

## 2021-06-21 DIAGNOSIS — J449 Chronic obstructive pulmonary disease, unspecified: Secondary | ICD-10-CM | POA: Diagnosis not present

## 2021-06-21 DIAGNOSIS — I5032 Chronic diastolic (congestive) heart failure: Secondary | ICD-10-CM | POA: Diagnosis not present

## 2021-06-21 DIAGNOSIS — I10 Essential (primary) hypertension: Secondary | ICD-10-CM | POA: Diagnosis not present

## 2021-06-21 DIAGNOSIS — E7849 Other hyperlipidemia: Secondary | ICD-10-CM | POA: Diagnosis not present

## 2021-06-22 DIAGNOSIS — I4891 Unspecified atrial fibrillation: Secondary | ICD-10-CM | POA: Diagnosis not present

## 2021-06-22 DIAGNOSIS — I352 Nonrheumatic aortic (valve) stenosis with insufficiency: Secondary | ICD-10-CM | POA: Diagnosis not present

## 2021-06-23 DIAGNOSIS — I352 Nonrheumatic aortic (valve) stenosis with insufficiency: Secondary | ICD-10-CM | POA: Diagnosis not present

## 2021-06-23 DIAGNOSIS — J329 Chronic sinusitis, unspecified: Secondary | ICD-10-CM | POA: Diagnosis not present

## 2021-06-23 DIAGNOSIS — I4891 Unspecified atrial fibrillation: Secondary | ICD-10-CM | POA: Diagnosis not present

## 2021-06-23 DIAGNOSIS — Z87891 Personal history of nicotine dependence: Secondary | ICD-10-CM | POA: Diagnosis not present

## 2021-06-23 DIAGNOSIS — Z7901 Long term (current) use of anticoagulants: Secondary | ICD-10-CM | POA: Diagnosis not present

## 2021-06-23 DIAGNOSIS — M4856XD Collapsed vertebra, not elsewhere classified, lumbar region, subsequent encounter for fracture with routine healing: Secondary | ICD-10-CM | POA: Diagnosis not present

## 2021-06-23 DIAGNOSIS — Z5181 Encounter for therapeutic drug level monitoring: Secondary | ICD-10-CM | POA: Diagnosis not present

## 2021-06-25 DIAGNOSIS — I352 Nonrheumatic aortic (valve) stenosis with insufficiency: Secondary | ICD-10-CM | POA: Diagnosis not present

## 2021-06-25 DIAGNOSIS — I4891 Unspecified atrial fibrillation: Secondary | ICD-10-CM | POA: Diagnosis not present

## 2021-06-25 DIAGNOSIS — Z5181 Encounter for therapeutic drug level monitoring: Secondary | ICD-10-CM | POA: Diagnosis not present

## 2021-06-25 DIAGNOSIS — M4856XD Collapsed vertebra, not elsewhere classified, lumbar region, subsequent encounter for fracture with routine healing: Secondary | ICD-10-CM | POA: Diagnosis not present

## 2021-06-25 DIAGNOSIS — Z7901 Long term (current) use of anticoagulants: Secondary | ICD-10-CM | POA: Diagnosis not present

## 2021-06-25 DIAGNOSIS — Z87891 Personal history of nicotine dependence: Secondary | ICD-10-CM | POA: Diagnosis not present

## 2021-06-25 DIAGNOSIS — J329 Chronic sinusitis, unspecified: Secondary | ICD-10-CM | POA: Diagnosis not present

## 2021-06-28 DIAGNOSIS — J329 Chronic sinusitis, unspecified: Secondary | ICD-10-CM | POA: Diagnosis not present

## 2021-06-28 DIAGNOSIS — I352 Nonrheumatic aortic (valve) stenosis with insufficiency: Secondary | ICD-10-CM | POA: Diagnosis not present

## 2021-06-28 DIAGNOSIS — M4856XD Collapsed vertebra, not elsewhere classified, lumbar region, subsequent encounter for fracture with routine healing: Secondary | ICD-10-CM | POA: Diagnosis not present

## 2021-06-28 DIAGNOSIS — Z87891 Personal history of nicotine dependence: Secondary | ICD-10-CM | POA: Diagnosis not present

## 2021-06-28 DIAGNOSIS — Z5181 Encounter for therapeutic drug level monitoring: Secondary | ICD-10-CM | POA: Diagnosis not present

## 2021-06-28 DIAGNOSIS — I4891 Unspecified atrial fibrillation: Secondary | ICD-10-CM | POA: Diagnosis not present

## 2021-06-28 DIAGNOSIS — Z7901 Long term (current) use of anticoagulants: Secondary | ICD-10-CM | POA: Diagnosis not present

## 2021-07-02 DIAGNOSIS — I4891 Unspecified atrial fibrillation: Secondary | ICD-10-CM | POA: Diagnosis not present

## 2021-07-02 DIAGNOSIS — I352 Nonrheumatic aortic (valve) stenosis with insufficiency: Secondary | ICD-10-CM | POA: Diagnosis not present

## 2021-07-02 DIAGNOSIS — M4856XD Collapsed vertebra, not elsewhere classified, lumbar region, subsequent encounter for fracture with routine healing: Secondary | ICD-10-CM | POA: Diagnosis not present

## 2021-07-02 DIAGNOSIS — J329 Chronic sinusitis, unspecified: Secondary | ICD-10-CM | POA: Diagnosis not present

## 2021-07-02 DIAGNOSIS — Z5181 Encounter for therapeutic drug level monitoring: Secondary | ICD-10-CM | POA: Diagnosis not present

## 2021-07-02 DIAGNOSIS — Z87891 Personal history of nicotine dependence: Secondary | ICD-10-CM | POA: Diagnosis not present

## 2021-07-02 DIAGNOSIS — Z7901 Long term (current) use of anticoagulants: Secondary | ICD-10-CM | POA: Diagnosis not present

## 2021-07-05 DIAGNOSIS — I4891 Unspecified atrial fibrillation: Secondary | ICD-10-CM | POA: Diagnosis not present

## 2021-07-05 DIAGNOSIS — Z7901 Long term (current) use of anticoagulants: Secondary | ICD-10-CM | POA: Diagnosis not present

## 2021-07-05 DIAGNOSIS — M19021 Primary osteoarthritis, right elbow: Secondary | ICD-10-CM | POA: Diagnosis not present

## 2021-07-05 DIAGNOSIS — J329 Chronic sinusitis, unspecified: Secondary | ICD-10-CM | POA: Diagnosis not present

## 2021-07-05 DIAGNOSIS — I352 Nonrheumatic aortic (valve) stenosis with insufficiency: Secondary | ICD-10-CM | POA: Diagnosis not present

## 2021-07-05 DIAGNOSIS — M4856XD Collapsed vertebra, not elsewhere classified, lumbar region, subsequent encounter for fracture with routine healing: Secondary | ICD-10-CM | POA: Diagnosis not present

## 2021-07-05 DIAGNOSIS — Z5181 Encounter for therapeutic drug level monitoring: Secondary | ICD-10-CM | POA: Diagnosis not present

## 2021-07-05 DIAGNOSIS — Z87891 Personal history of nicotine dependence: Secondary | ICD-10-CM | POA: Diagnosis not present

## 2021-07-08 DIAGNOSIS — M5126 Other intervertebral disc displacement, lumbar region: Secondary | ICD-10-CM | POA: Diagnosis not present

## 2021-07-08 DIAGNOSIS — M5416 Radiculopathy, lumbar region: Secondary | ICD-10-CM | POA: Diagnosis not present

## 2021-07-09 DIAGNOSIS — I352 Nonrheumatic aortic (valve) stenosis with insufficiency: Secondary | ICD-10-CM | POA: Diagnosis not present

## 2021-07-09 DIAGNOSIS — Z87891 Personal history of nicotine dependence: Secondary | ICD-10-CM | POA: Diagnosis not present

## 2021-07-09 DIAGNOSIS — M4856XD Collapsed vertebra, not elsewhere classified, lumbar region, subsequent encounter for fracture with routine healing: Secondary | ICD-10-CM | POA: Diagnosis not present

## 2021-07-09 DIAGNOSIS — I4891 Unspecified atrial fibrillation: Secondary | ICD-10-CM | POA: Diagnosis not present

## 2021-07-09 DIAGNOSIS — Z5181 Encounter for therapeutic drug level monitoring: Secondary | ICD-10-CM | POA: Diagnosis not present

## 2021-07-09 DIAGNOSIS — Z7901 Long term (current) use of anticoagulants: Secondary | ICD-10-CM | POA: Diagnosis not present

## 2021-07-09 DIAGNOSIS — J329 Chronic sinusitis, unspecified: Secondary | ICD-10-CM | POA: Diagnosis not present

## 2021-07-14 DIAGNOSIS — Z043 Encounter for examination and observation following other accident: Secondary | ICD-10-CM | POA: Diagnosis not present

## 2021-07-14 DIAGNOSIS — Z20822 Contact with and (suspected) exposure to covid-19: Secondary | ICD-10-CM | POA: Diagnosis not present

## 2021-07-14 DIAGNOSIS — D649 Anemia, unspecified: Secondary | ICD-10-CM | POA: Diagnosis not present

## 2021-07-14 DIAGNOSIS — M47812 Spondylosis without myelopathy or radiculopathy, cervical region: Secondary | ICD-10-CM | POA: Diagnosis not present

## 2021-07-14 DIAGNOSIS — Z743 Need for continuous supervision: Secondary | ICD-10-CM | POA: Diagnosis not present

## 2021-07-14 DIAGNOSIS — Z9181 History of falling: Secondary | ICD-10-CM | POA: Diagnosis not present

## 2021-07-14 DIAGNOSIS — I517 Cardiomegaly: Secondary | ICD-10-CM | POA: Diagnosis not present

## 2021-07-14 DIAGNOSIS — S0990XA Unspecified injury of head, initial encounter: Secondary | ICD-10-CM | POA: Diagnosis not present

## 2021-07-14 DIAGNOSIS — Z7901 Long term (current) use of anticoagulants: Secondary | ICD-10-CM | POA: Diagnosis not present

## 2021-07-14 DIAGNOSIS — W19XXXA Unspecified fall, initial encounter: Secondary | ICD-10-CM | POA: Diagnosis not present

## 2021-07-14 DIAGNOSIS — E869 Volume depletion, unspecified: Secondary | ICD-10-CM | POA: Diagnosis not present

## 2021-07-14 DIAGNOSIS — Z7401 Bed confinement status: Secondary | ICD-10-CM | POA: Diagnosis not present

## 2021-07-14 DIAGNOSIS — R531 Weakness: Secondary | ICD-10-CM | POA: Diagnosis not present

## 2021-07-15 DIAGNOSIS — J449 Chronic obstructive pulmonary disease, unspecified: Secondary | ICD-10-CM | POA: Diagnosis not present

## 2021-07-15 DIAGNOSIS — I352 Nonrheumatic aortic (valve) stenosis with insufficiency: Secondary | ICD-10-CM | POA: Diagnosis not present

## 2021-07-15 DIAGNOSIS — E7849 Other hyperlipidemia: Secondary | ICD-10-CM | POA: Diagnosis not present

## 2021-07-15 DIAGNOSIS — I5032 Chronic diastolic (congestive) heart failure: Secondary | ICD-10-CM | POA: Diagnosis not present

## 2021-07-15 DIAGNOSIS — I10 Essential (primary) hypertension: Secondary | ICD-10-CM | POA: Diagnosis not present

## 2021-08-19 DIAGNOSIS — J329 Chronic sinusitis, unspecified: Secondary | ICD-10-CM | POA: Diagnosis not present

## 2021-08-19 DIAGNOSIS — M4856XD Collapsed vertebra, not elsewhere classified, lumbar region, subsequent encounter for fracture with routine healing: Secondary | ICD-10-CM | POA: Diagnosis not present

## 2021-08-19 DIAGNOSIS — Z7901 Long term (current) use of anticoagulants: Secondary | ICD-10-CM | POA: Diagnosis not present

## 2021-08-19 DIAGNOSIS — I4891 Unspecified atrial fibrillation: Secondary | ICD-10-CM | POA: Diagnosis not present

## 2021-08-19 DIAGNOSIS — I352 Nonrheumatic aortic (valve) stenosis with insufficiency: Secondary | ICD-10-CM | POA: Diagnosis not present

## 2021-08-19 DIAGNOSIS — Z87891 Personal history of nicotine dependence: Secondary | ICD-10-CM | POA: Diagnosis not present

## 2021-08-19 DIAGNOSIS — Z5181 Encounter for therapeutic drug level monitoring: Secondary | ICD-10-CM | POA: Diagnosis not present

## 2021-10-02 DEATH — deceased
